# Patient Record
Sex: Female | Born: 1985 | Race: White | Hispanic: No | Marital: Married | Smoking: Never smoker
Health system: Southern US, Community
[De-identification: ages and names within clinical notes are randomized; demographics above are authoritative.]

## PROBLEM LIST (undated history)

## (undated) DIAGNOSIS — J301 Allergic rhinitis due to pollen: Secondary | ICD-10-CM

## (undated) DIAGNOSIS — B019 Varicella without complication: Secondary | ICD-10-CM

## (undated) DIAGNOSIS — R17 Unspecified jaundice: Secondary | ICD-10-CM

## (undated) DIAGNOSIS — O9A213 Injury, poisoning and certain other consequences of external causes complicating pregnancy, third trimester: Secondary | ICD-10-CM

## (undated) DIAGNOSIS — J45909 Unspecified asthma, uncomplicated: Secondary | ICD-10-CM

## (undated) DIAGNOSIS — F419 Anxiety disorder, unspecified: Secondary | ICD-10-CM

## (undated) DIAGNOSIS — N921 Excessive and frequent menstruation with irregular cycle: Secondary | ICD-10-CM

## (undated) DIAGNOSIS — F32A Depression, unspecified: Secondary | ICD-10-CM

## (undated) DIAGNOSIS — T7840XA Allergy, unspecified, initial encounter: Secondary | ICD-10-CM

## (undated) DIAGNOSIS — K219 Gastro-esophageal reflux disease without esophagitis: Secondary | ICD-10-CM

## (undated) HISTORY — DX: Anxiety disorder, unspecified: F41.9

## (undated) HISTORY — DX: Allergy, unspecified, initial encounter: T78.40XA

## (undated) HISTORY — DX: Unspecified asthma, uncomplicated: J45.909

## (undated) HISTORY — DX: Depression, unspecified: F32.A

## (undated) HISTORY — PX: WISDOM TOOTH EXTRACTION: SHX21

## (undated) HISTORY — DX: Gastro-esophageal reflux disease without esophagitis: K21.9

## (undated) HISTORY — DX: Unspecified jaundice: R17

## (undated) HISTORY — DX: Excessive and frequent menstruation with irregular cycle: N92.1

## (undated) HISTORY — DX: Varicella without complication: B01.9

## (undated) HISTORY — DX: Allergic rhinitis due to pollen: J30.1

---

## 2008-01-06 ENCOUNTER — Other Ambulatory Visit: Admission: RE | Admit: 2008-01-06 | Discharge: 2008-01-06 | Payer: Self-pay | Admitting: Obstetrics and Gynecology

## 2008-12-25 ENCOUNTER — Other Ambulatory Visit: Admission: RE | Admit: 2008-12-25 | Discharge: 2008-12-25 | Payer: Self-pay | Admitting: Obstetrics and Gynecology

## 2011-08-09 HISTORY — PX: CERVICAL BIOPSY  W/ LOOP ELECTRODE EXCISION: SUR135

## 2011-12-25 LAB — HM PAP SMEAR: HM Pap smear: NORMAL

## 2012-05-17 ENCOUNTER — Ambulatory Visit: Payer: Self-pay | Admitting: Internal Medicine

## 2012-05-24 ENCOUNTER — Ambulatory Visit (INDEPENDENT_AMBULATORY_CARE_PROVIDER_SITE_OTHER): Payer: BC Managed Care – PPO | Admitting: Internal Medicine

## 2012-05-24 ENCOUNTER — Encounter: Payer: Self-pay | Admitting: Internal Medicine

## 2012-05-24 VITALS — BP 130/70 | HR 60 | Temp 98.5°F | Ht 68.5 in | Wt 177.8 lb

## 2012-05-24 DIAGNOSIS — IMO0002 Reserved for concepts with insufficient information to code with codable children: Secondary | ICD-10-CM

## 2012-05-24 DIAGNOSIS — R87619 Unspecified abnormal cytological findings in specimens from cervix uteri: Secondary | ICD-10-CM | POA: Insufficient documentation

## 2012-05-24 DIAGNOSIS — J45990 Exercise induced bronchospasm: Secondary | ICD-10-CM | POA: Insufficient documentation

## 2012-05-24 DIAGNOSIS — R6889 Other general symptoms and signs: Secondary | ICD-10-CM

## 2012-05-24 NOTE — Assessment & Plan Note (Signed)
Symptomatically doing well with albuterol prior to exercise. Will continue to monitor. Will get records on prior evaluation.

## 2012-05-24 NOTE — Progress Notes (Signed)
  Subjective:    Patient ID: Elizabeth Harding, female    DOB: 1985/12/30, 26 y.o.   MRN: [id number]  HPI 26 year old female with history of asthma presents to establish care. She reports that she is generally doing well. She has no concerns today. She reports that her asthma symptoms of shortness of breath and cough are well controlled with using albuterol prior to exercise. She has never been hospitalized for asthma. She does not smoke. She is not exposed to secondhand smoke. She has never been on maintenance medications.  She generally reports she is feeling well. She is very active, exercising with a trainer and swimming several days per week. She follows a relatively healthy diet.  Outpatient Encounter Prescriptions as of 05/24/2012  Medication Sig Dispense Refill  . albuterol (PROVENTIL HFA;VENTOLIN HFA) 108 (90 BASE) MCG/ACT inhaler Inhale 2 puffs into the lungs every 6 (six) hours as needed.      . norethindrone-ethinyl estradiol (MICROGESTIN,JUNEL,LOESTRIN) 1-20 MG-MCG tablet Take 1 tablet by mouth daily.        Review of Systems  Constitutional: Negative for fever, chills, appetite change, fatigue and unexpected weight change.  HENT: Negative for ear pain, congestion, sore throat, trouble swallowing, neck pain, voice change and sinus pressure.   Eyes: Negative for visual disturbance.  Respiratory: Negative for cough, shortness of breath, wheezing and stridor.   Cardiovascular: Negative for chest pain, palpitations and leg swelling.  Gastrointestinal: Negative for nausea, vomiting, abdominal pain, diarrhea, constipation, blood in stool, abdominal distention and anal bleeding.  Genitourinary: Negative for dysuria and flank pain.  Musculoskeletal: Negative for myalgias, arthralgias and gait problem.  Skin: Negative for color change and rash.  Neurological: Negative for dizziness and headaches.  Hematological: Negative for adenopathy. Does not bruise/bleed easily.    Psychiatric/Behavioral: Negative for suicidal ideas, disturbed wake/sleep cycle and dysphoric mood. The patient is not nervous/anxious.        Objective:   Physical Exam  Constitutional: She is oriented to person, place, and time. She appears well-developed and well-nourished. No distress.  HENT:  Head: Normocephalic and atraumatic.  Right Ear: External ear normal.  Left Ear: External ear normal.  Nose: Nose normal.  Mouth/Throat: Oropharynx is clear and moist. No oropharyngeal exudate.  Eyes: Conjunctivae are normal. Pupils are equal, round, and reactive to light. Right eye exhibits no discharge. Left eye exhibits no discharge. No scleral icterus.  Neck: Normal range of motion. Neck supple. No tracheal deviation present. No thyromegaly present.  Cardiovascular: Normal rate, regular rhythm, normal heart sounds and intact distal pulses.  Exam reveals no gallop and no friction rub.   No murmur heard. Pulmonary/Chest: Effort normal and breath sounds normal. No respiratory distress. She has no wheezes. She has no rales. She exhibits no tenderness.  Abdominal: Soft. Bowel sounds are normal. She exhibits no distension and no mass. There is no tenderness. There is no guarding.  Musculoskeletal: Normal range of motion. She exhibits no edema and no tenderness.  Lymphadenopathy:    She has no cervical adenopathy.  Neurological: She is alert and oriented to person, place, and time. No cranial nerve deficit. She exhibits normal muscle tone. Coordination normal.  Skin: Skin is warm and dry. No rash noted. She is not diaphoretic. No erythema. No pallor.  Psychiatric: She has a normal mood and affect. Her behavior is normal. Judgment and thought content normal.          Assessment & Plan:

## 2012-05-24 NOTE — Assessment & Plan Note (Signed)
Patient reports history of abnormal Pap smear status post LEEP procedure in the past. Will get notes on previous evaluation.

## 2012-07-22 ENCOUNTER — Telehealth: Payer: Self-pay | Admitting: Internal Medicine

## 2012-07-22 NOTE — Telephone Encounter (Signed)
Will address at office visit

## 2012-07-22 NOTE — Telephone Encounter (Signed)
Caller: Julieth/Patient; Patient Name: Elizabeth Harding; PCP: Ronna Polio; Best Callback Phone Number: [phone]. Left Wrist Pain.Onset about 4 weeks ago.  No known trauma.  Pain when weight bearing and with flexion and extension.  All emergent signs and symptoms ruled out per Wrist Non Injury  with exception to 'Limitation of previously tolerated activities'.   See Provider within 2 wks. Appt scheduled 07/23/12 @ 0945 for Dr. Darrick Huntsman.

## 2012-07-23 ENCOUNTER — Ambulatory Visit (INDEPENDENT_AMBULATORY_CARE_PROVIDER_SITE_OTHER)
Admission: RE | Admit: 2012-07-23 | Discharge: 2012-07-23 | Disposition: A | Discharge status: Home / Self Care | Payer: BC Managed Care – PPO | Source: Ambulatory Visit | Attending: Internal Medicine | Admitting: Internal Medicine

## 2012-07-23 ENCOUNTER — Ambulatory Visit (INDEPENDENT_AMBULATORY_CARE_PROVIDER_SITE_OTHER): Payer: BC Managed Care – PPO | Admitting: Internal Medicine

## 2012-07-23 ENCOUNTER — Encounter: Payer: Self-pay | Admitting: Internal Medicine

## 2012-07-23 VITALS — BP 118/60 | HR 62 | Temp 98.0°F | Resp 16 | Wt 179.2 lb

## 2012-07-23 DIAGNOSIS — M25539 Pain in unspecified wrist: Secondary | ICD-10-CM

## 2012-07-23 DIAGNOSIS — M25532 Pain in left wrist: Secondary | ICD-10-CM

## 2012-07-23 LAB — COMPREHENSIVE METABOLIC PANEL
ALT: 18 U/L (ref 0–35)
AST: 24 U/L (ref 0–37)
Albumin: 4.1 g/dL (ref 3.5–5.2)
BUN: 9 mg/dL (ref 6–23)
Calcium: 9.1 mg/dL (ref 8.4–10.5)
Chloride: 104 mEq/L (ref 96–112)
Potassium: 3.6 mEq/L (ref 3.5–5.1)

## 2012-07-23 NOTE — Progress Notes (Signed)
Patient ID: Elizabeth Harding, female   DOB: 1986-10-27, 26 y.o.   MRN: [id number]  Patient Active Problem List  Diagnosis  . Exercise-induced asthma  . Abnormal pap  . Left wrist pain    Subjective:  CC:   Chief Complaint  Patient presents with  . Wrist Pain    HPI:   Elizabeth Harding a 26 y.o. female who present Lwith new onset left sided wrist pain.  Symptoms started about 4 weeks ago and resolved after a couple of days of using a soft wrist brace. There is no history of trauma. The last 3-4 weeks she has had intermittent pain lasting a day or 2 at a time. The last week she started having persistent pain and changed the brace to one with a metal stave in it. Despite using the brace and avoiding any physical activity she has had increased pain and can now barely put any weight on the wrist all without pain. 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 No history, no history of weight lifting or sports activities. No history of inflammatory arthritis. Spends a maximum of 2 hours a day at the keyboard. No prior trial of anti-inflammatories.   Past Medical History  Diagnosis Date  . Asthma   . Chicken pox   . Allergy     hay fever  . Jaundice     as a newborn    Past Surgical History  Procedure Date  . Wisdom tooth extraction   . Cervical biopsy  w/ loop electrode excision     CIN[facility]         The following portions of the patient's history were reviewed and updated as appropriate: Allergies, current medications, and problem list.    Review of Systems:   12 Pt  review of systems was negative except those addressed in the HPI.     History   Social History  . Marital Status: Single    Spouse Name: N/A    Number of Children: N/A  . Years of Education: N/A   Occupational History  . Not on file.   Social History Main Topics  . Smoking status: Never Smoker   . Smokeless tobacco: Not on file  .  Alcohol Use: Yes     occas  . Drug Use: Not on file  . Sexually Active: Not on file   Other Topics Concern  . Not on file   Social History Narrative   Lives alone in [place]. Has cat. Teacher.Exercise - [place], with trainerDiet - regular    Objective:  BP 118/60  Pulse 62  Temp 98 F (36.7 C) (Oral)  Resp 16  Wt 179 lb 4 oz (81.307 kg)  SpO2 99%  LMP 07/05/2012  General appearance: alert, cooperative and appears stated age Neck: no adenopathy, no carotid bruit, supple, symmetrical, trachea midline and thyroid not enlarged, symmetric, no tenderness/mass/nodules Back: symmetric, no curvature. ROM normal. No CVA tenderness. Lungs: clear to auscultation bilaterally Heart: regular rate and rhythm, S1, S2 normal, no murmur, click, rub or gallop Abdomen: soft, non-tender; bowel sounds normal; no masses,  no organomegaly Pulses: 2+ and symmetric Skin: Skin color, texture, turgor normal. No rashes or lesions MSK: left wrist without swelling, warmth or redness.  ROM full.  Some pain with extension. No focal tenderness. Lymph nodes: Cervical, supraclavicular, and axillary nodes normal.  Assessment and Plan:  Left wrist pain Ideology appears to be tendinitis or  arthritis. Plain views of the wrist showed no evidence of fractures or joint space widening. Recommended trial of anti-inflammatories and pain relievers with continued use of brace. If no improvement 2 weeks will refer to rheumatologist versus sports medicine doctor depending on the results of the autoimmune serologies I sent off for today.   Updated Medication List Outpatient Encounter Prescriptions as of 07/23/2012  Medication Sig Dispense Refill  . albuterol (PROVENTIL HFA;VENTOLIN HFA) 108 (90 BASE) MCG/ACT inhaler Inhale 2 puffs into the lungs every 6 (six) hours as needed.      . norethindrone-ethinyl estradiol (MICROGESTIN,JUNEL,LOESTRIN) 1-20 MG-MCG tablet Take 1 tablet by mouth daily.         Orders Placed This Encounter   Procedures  . DG Wrist Complete Left  . Comprehensive metabolic panel  . Sedimentation rate  . Rheumatoid factor  . ANA  . Uric acid    No Follow-up on file.

## 2012-07-23 NOTE — Patient Instructions (Signed)
You may use 800 mg ibuprofen  Every 8 hours or the alleve twice daily  ( as directed ) PLUS tylenol (max 2000 mg daily )    blood tests here,  A x rays at [facility]

## 2012-07-25 ENCOUNTER — Encounter: Payer: Self-pay | Admitting: Internal Medicine

## 2012-07-25 DIAGNOSIS — M25532 Pain in left wrist: Secondary | ICD-10-CM | POA: Insufficient documentation

## 2012-07-25 NOTE — Assessment & Plan Note (Signed)
Ideology appears to be tendinitis or arthritis. Plain views of the wrist showed no evidence of fractures or joint space widening. Recommended trial of anti-inflammatories and pain relievers with continued use of brace. If no improvement 2 weeks will refer to rheumatologist versus sports medicine doctor depending on the results of the autoimmune serologies I sent off for today.

## 2012-12-27 ENCOUNTER — Encounter: Payer: Self-pay | Admitting: Family Medicine

## 2012-12-27 ENCOUNTER — Ambulatory Visit (INDEPENDENT_AMBULATORY_CARE_PROVIDER_SITE_OTHER): Payer: BC Managed Care – PPO | Admitting: Family Medicine

## 2012-12-27 VITALS — BP 110/70 | HR 83 | Temp 98.5°F | Ht 68.5 in | Wt 181.8 lb

## 2012-12-27 DIAGNOSIS — J45990 Exercise induced bronchospasm: Secondary | ICD-10-CM

## 2012-12-27 MED ORDER — ALBUTEROL SULFATE HFA 108 (90 BASE) MCG/ACT IN AERS: 2.0000 | INHALATION_SPRAY | Freq: Four times a day (QID) | RESPIRATORY_TRACT | Status: DC | PRN

## 2012-12-27 NOTE — Progress Notes (Signed)
[facility] [address] [place] [address] Phone: [phone] Fax: [phone]  Date:  12/27/2012   Name:  Elizabeth Harding   DOB:  Apr 06, 1986   MRN:  [id number] Gender: female Age: 27 y.o.  PCP:  Hannah Beat, MD  Evaluating MD: Hannah Beat, MD   Chief Complaint: Establish Care   History of Present Illness:  Elizabeth Harding is a 27 y.o. pleasant patient who presents with the following:  Was going to [facility] and then [employer], now transferring care to our office.  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 When she was younger, she did have rare wheezing with illness, as well. Stable, but she needs a refill.  On OCP's and sees OB-Gyn  O/w feeling well, routinely exercising and now she is working on getting her master's degree.  Patient Active Problem List  Diagnosis  . Exercise-induced asthma  . Abnormal pap    Past Medical History  Diagnosis Date  . Asthma   . Chicken pox   . Allergy     hay fever  . Jaundice     as a newborn    Past Surgical History  Procedure Date  . Wisdom tooth extraction   . 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 Smoking status: Never Smoker   . Smokeless tobacco: Not on file  . Alcohol Use: Yes     Comment: occas    Family History  Problem Relation Age of Onset  . Hypertension Other   . Diabetes Other   . Heart disease Paternal Grandmother     No Known Allergies  Medication list has been reviewed and updated.  Outpatient Prescriptions Prior to Visit  Medication Sig Dispense Refill  . albuterol (PROVENTIL HFA;VENTOLIN HFA) 108 (90 BASE) MCG/ACT inhaler Inhale 2 puffs into the lungs every 6 (six) hours as needed.      . norethindrone-ethinyl estradiol (MICROGESTIN,JUNEL,LOESTRIN) 1-20 MG-MCG tablet  Take 1 tablet by mouth daily.       Last reviewed on 07/25/2012 11:00 AM by Sherlene Shams, MD  Review of Systems:   GEN: No acute illnesses, no fevers, chills. GI: No n/v/d, eating normally Pulm: No SOB Interactive and getting along well at home.  Otherwise, ROS is as per the HPI.   Physical Examination: BP 110/70  Pulse 83  Temp 98.5 F (36.9 C) (Oral)  Ht 5' 8.5" (1.74 m)  Wt 181 lb 12 oz (82.441 kg)  BMI 27.23 kg/m2  SpO2 98%  Ideal Body Weight: Weight in (lb) to have BMI = 25: 166.5    GEN: WDWN, NAD, Non-toxic, A & O x 3 HEENT: Atraumatic, Normocephalic. Neck supple. No masses, No LAD. Ears and Nose: No external deformity. CV: RRR, No M/G/R. No JVD. No thrill. No extra heart sounds. PULM: CTA B, no wheezes, crackles, rhonchi. No retractions. No resp. distress. No accessory muscle use. EXTR: No c/c/e NEURO Normal gait.  PSYCH: Normally interactive. Conversant. Not depressed or anxious appearing.  Calm demeanor.    Assessment and Plan:  1. Exercise-induced asthma     Stable pleasant new patient - refilled B-agonist  Orders Today:  No orders of the defined types were placed in this encounter.    Updated Medication List: (Includes new medications, updates to list, dose adjustments)  Meds ordered this encounter  Medications  . albuterol (PROAIR HFA) 108 (90 BASE) MCG/ACT inhaler    Sig: Inhale 2 puffs into the lungs every 6 (six) hours as needed for wheezing.    Dispense:  1 Inhaler    Refill:  4    Medications Discontinued: Medications Discontinued During This Encounter  Medication Reason  . albuterol (PROVENTIL HFA;VENTOLIN HFA) 108 (90 BASE) MCG/ACT inhaler      Hannah Beat, MD

## 2012-12-28 ENCOUNTER — Encounter: Payer: Self-pay | Admitting: Family Medicine

## 2013-05-23 ENCOUNTER — Ambulatory Visit: Payer: BC Managed Care – PPO | Admitting: Family Medicine

## 2013-05-27 ENCOUNTER — Encounter: Payer: Self-pay | Admitting: Family Medicine

## 2013-05-27 ENCOUNTER — Ambulatory Visit (INDEPENDENT_AMBULATORY_CARE_PROVIDER_SITE_OTHER): Payer: BC Managed Care – PPO | Admitting: Family Medicine

## 2013-05-27 VITALS — BP 110/66 | HR 70 | Temp 98.6°F | Wt 185.5 lb

## 2013-05-27 DIAGNOSIS — J019 Acute sinusitis, unspecified: Secondary | ICD-10-CM

## 2013-05-27 DIAGNOSIS — J0110 Acute frontal sinusitis, unspecified: Secondary | ICD-10-CM | POA: Insufficient documentation

## 2013-05-27 MED ORDER — AMOXICILLIN 500 MG PO CAPS: 1000.0000 mg | ORAL_CAPSULE | Freq: Two times a day (BID) | ORAL | Status: DC

## 2013-05-27 NOTE — Assessment & Plan Note (Signed)
>   2 weeks.. Will treat with antibitoics. Symptomatic care. Nasal irrigation.

## 2013-05-27 NOTE — Patient Instructions (Signed)
Start mucinex D. Continue using netty pot. Start and complete antibiotics. Call if not improving as expected.

## 2013-05-27 NOTE — Progress Notes (Signed)
  Subjective:    Patient ID: Elizabeth Harding, female    DOB: [birth year], 27 y.o.   MRN: [id number]  Sinusitis This is a new problem. The current episode started 1 to 4 weeks ago. The problem has been gradually worsening since onset. There has been no fever. The pain is moderate. Associated symptoms include congestion, headaches, sinus pressure and sneezing. Pertinent negatives include no chills, coughing, ear pain, shortness of breath or swollen glands. Past treatments include oral decongestants (zyrtec, sudafed). The treatment provided moderate relief.       Review of Systems  Constitutional: Negative for chills.  HENT: Positive for congestion, sneezing and sinus pressure. Negative for ear pain.   Respiratory: Negative for cough and shortness of breath.   Neurological: Positive for headaches.       Objective:   Physical Exam  Constitutional: Vital signs are normal. She appears well-developed and well-nourished. She is cooperative.  Non-toxic appearance. She does not appear ill. No distress.  HENT:  Head: Normocephalic.  Right Ear: Hearing, tympanic membrane, external ear and ear canal normal. Tympanic membrane is not erythematous, not retracted and not bulging.  Left Ear: Hearing, tympanic membrane, external ear and ear canal normal. Tympanic membrane is not erythematous, not retracted and not bulging.  Nose: Mucosal edema and rhinorrhea present. Right sinus exhibits maxillary sinus tenderness (left greater than right). Right sinus exhibits no frontal sinus tenderness. Left sinus exhibits maxillary sinus tenderness. Left sinus exhibits no frontal sinus tenderness.  Mouth/Throat: Uvula is midline, oropharynx is clear and moist and mucous membranes are normal.  Eyes: Conjunctivae, EOM and lids are normal. Pupils are equal, round, and reactive to light. No foreign bodies found.  Neck: Trachea normal and normal range of motion. Neck supple. Carotid bruit is not present. No mass and no  thyromegaly present.  Cardiovascular: Normal rate, regular rhythm, S1 normal, S2 normal, normal heart sounds, intact distal pulses and normal pulses.  Exam reveals no gallop and no friction rub.   No murmur heard. Pulmonary/Chest: Effort normal and breath sounds normal. Not tachypneic. No respiratory distress. She has no decreased breath sounds. She has no wheezes. She has no rhonchi. She has no rales.  Neurological: She is alert.  Skin: Skin is warm, dry and intact. No rash noted.  Psychiatric: Her speech is normal and behavior is normal. Judgment normal. Her mood appears not anxious. Cognition and memory are normal. She does not exhibit a depressed mood.          Assessment & Plan:

## 2013-11-15 ENCOUNTER — Encounter: Payer: Self-pay | Admitting: Nurse Practitioner

## 2013-12-01 ENCOUNTER — Other Ambulatory Visit: Payer: Self-pay | Admitting: Nurse Practitioner

## 2013-12-05 NOTE — Telephone Encounter (Signed)
Annual Exam scheduled for 01/17/14

## 2014-01-10 ENCOUNTER — Ambulatory Visit: Payer: Self-pay | Admitting: Nurse Practitioner

## 2014-01-17 ENCOUNTER — Ambulatory Visit: Payer: Self-pay | Admitting: Nurse Practitioner

## 2014-01-23 ENCOUNTER — Encounter: Payer: Self-pay | Admitting: Nurse Practitioner

## 2014-01-23 ENCOUNTER — Ambulatory Visit (INDEPENDENT_AMBULATORY_CARE_PROVIDER_SITE_OTHER): Payer: BC Managed Care – PPO | Admitting: Nurse Practitioner

## 2014-01-23 VITALS — BP 120/74 | HR 68 | Ht 68.0 in | Wt 177.0 lb

## 2014-01-23 DIAGNOSIS — Z01419 Encounter for gynecological examination (general) (routine) without abnormal findings: Secondary | ICD-10-CM

## 2014-01-23 DIAGNOSIS — R87612 Low grade squamous intraepithelial lesion on cytologic smear of cervix (LGSIL): Secondary | ICD-10-CM

## 2014-01-23 DIAGNOSIS — Z0000 Encounter for general adult medical examination without abnormal findings: Secondary | ICD-10-CM

## 2014-01-23 LAB — POCT URINALYSIS DIPSTICK
Bilirubin, UA: NEGATIVE
Blood, UA: NEGATIVE
Glucose, UA: NEGATIVE
Ketones, UA: NEGATIVE
Leukocytes, UA: NEGATIVE
Nitrite, UA: NEGATIVE
Protein, UA: NEGATIVE
Urobilinogen, UA: NEGATIVE
pH, UA: 6

## 2014-01-23 MED ORDER — NORETHIN ACE-ETH ESTRAD-FE 1-20 MG-MCG PO TABS: ORAL_TABLET | ORAL | Status: DC

## 2014-01-23 NOTE — Patient Instructions (Addendum)
General topics  Next pap or exam is  due in 1 year Take a Women's multivitamin Take 1200 mg. of calcium daily - prefer dietary If any concerns in interim to call back  Breast Self-Awareness Practicing breast self-awareness may pick up problems early, prevent significant medical complications, and possibly save your life. By practicing breast self-awareness, you can become familiar with how your breasts look and feel and if your breasts are changing. This allows you to notice changes early. It can also offer you some reassurance that your breast health is good. One way to learn what is normal for your breasts and whether your breasts are changing is to do a breast self-exam. If you find a lump or something that was not present in the past, it is best to contact your caregiver right away. Other findings that should be evaluated by your caregiver include nipple discharge, especially if it is bloody; skin changes or reddening; areas where the skin seems to be pulled in (retracted); or new lumps and bumps. Breast pain is seldom associated with cancer (malignancy), but should also be evaluated by a caregiver. BREAST SELF-EXAM The best time to examine your breasts is 5 7 days after your menstrual period is over.  ExitCare Patient Information 2013 ExitCare, LLC.   Exercise to Stay Healthy Exercise helps you become and stay healthy. EXERCISE IDEAS AND TIPS Choose exercises that:  You enjoy.  Fit into your day. You do not need to exercise really hard to be healthy. You can do exercises at a slow or medium level and stay healthy. You can:  Stretch before and after working out.  Try yoga, Pilates, or tai chi.  Lift weights.  Walk fast, swim, jog, run, climb stairs, bicycle, dance, or rollerskate.  Take aerobic classes. Exercises that burn about 150 calories:  Running 1  miles in 15 minutes.  Playing volleyball for 45 to 60 minutes.  Washing and waxing a car for 45 to 60  minutes.  Playing touch football for 45 minutes.  Walking 1  miles in 35 minutes.  Pushing a stroller 1  miles in 30 minutes.  Playing basketball for 30 minutes.  Raking leaves for 30 minutes.  Bicycling 5 miles in 30 minutes.  Walking 2 miles in 30 minutes.  Dancing for 30 minutes.  Shoveling snow for 15 minutes.  Swimming laps for 20 minutes.  Walking up stairs for 15 minutes.  Bicycling 4 miles in 15 minutes.  Gardening for 30 to 45 minutes.  Jumping rope for 15 minutes.  Washing windows or floors for 45 to 60 minutes. Document Released: 12/27/2010 Document Revised: 02/16/2012 Document Reviewed: 12/27/2010 ExitCare Patient Information 2013 ExitCare, LLC.   Other topics ( that may be useful information):    Sexually Transmitted Disease Sexually transmitted disease (STD) refers to any infection that is passed from person to person during sexual activity. This may happen by way of saliva, semen, blood, vaginal mucus, or urine. Common STDs include:  Gonorrhea.  Chlamydia.  Syphilis.  HIV/AIDS.  Genital herpes.  Hepatitis B and C.  Trichomonas.  Human papillomavirus (HPV).  Pubic lice. CAUSES  An STD may be spread by bacteria, virus, or parasite. A person can get an STD by:  Sexual intercourse with an infected person.  Sharing sex toys with an infected person.  Sharing needles with an infected person.  Having intimate contact with the genitals, mouth, or rectal areas of an infected person. SYMPTOMS  Some people may not have any symptoms, but   they can still pass the infection to others. Different STDs have different symptoms. Symptoms include:  Painful or bloody urination.  Pain in the pelvis, abdomen, vagina, anus, throat, or eyes.  Skin rash, itching, irritation, growths, or sores (lesions). These usually occur in the genital or anal area.  Abnormal vaginal discharge.  Penile discharge in men.  Soft, flesh-colored skin growths in the  genital or anal area.  Fever.  Pain or bleeding during sexual intercourse.  Swollen glands in the groin area.  Yellow skin and eyes (jaundice). This is seen with hepatitis. DIAGNOSIS  To make a diagnosis, your caregiver may:  Take a medical history.  Perform a physical exam.  Take a specimen (culture) to be examined.  Examine a sample of discharge under a microscope.  Perform blood test TREATMENT   Chlamydia, gonorrhea, trichomonas, and syphilis can be cured with antibiotic medicine.  Genital herpes, hepatitis, and HIV can be treated, but not cured, with prescribed medicines. The medicines will lessen the symptoms.  Genital warts from HPV can be treated with medicine or by freezing, burning (electrocautery), or surgery. Warts may come back.  HPV is a virus and cannot be cured with medicine or surgery.However, abnormal areas may be followed very closely by your caregiver and may be removed from the cervix, vagina, or vulva through office procedures or surgery. If your diagnosis is confirmed, your recent sexual partners need treatment. This is true even if they are symptom-free or have a negative culture or evaluation. They should not have sex until their caregiver says it is okay. HOME CARE INSTRUCTIONS  All sexual partners should be informed, tested, and treated for all STDs.  Take your antibiotics as directed. Finish them even if you start to feel better.  Only take over-the-counter or prescription medicines for pain, discomfort, or fever as directed by your caregiver.  Rest.  Eat a balanced diet and drink enough fluids to keep your urine clear or pale yellow.  Do not have sex until treatment is completed and you have followed up with your caregiver. STDs should be checked after treatment.  Keep all follow-up appointments, Pap tests, and blood tests as directed by your caregiver.  Only use latex condoms and water-soluble lubricants during sexual activity. Do not use  petroleum jelly or oils.  Avoid alcohol and illegal drugs.  Get vaccinated for HPV and hepatitis. If you have not received these vaccines in the past, talk to your caregiver about whether one or both might be right for you.  Avoid risky sex practices that can break the skin. The only way to avoid getting an STD is to avoid all sexual activity.Latex condoms and dental dams (for oral sex) will help lessen the risk of getting an STD, but will not completely eliminate the risk. SEEK MEDICAL CARE IF:   You have a fever.  You have any new or worsening symptoms. Document Released: 02/14/2003 Document Revised: 02/16/2012 Document Reviewed: 02/21/2011 [facility] Patient Information 2013 [place].    Domestic Abuse You are being battered or abused if someone close to you hits, pushes, or physically hurts you in any way. You also are being abused if you are forced into activities. You are being sexually abused if you are forced to have sexual contact of any kind. You are being emotionally abused if you are made to feel worthless or if you are constantly threatened. It is important to remember that help is available. No one has the right to abuse you. PREVENTION OF FURTHER  ABUSE  Learn the warning signs of danger. This varies with situations but may include: the use of alcohol, threats, isolation from friends and family, or forced sexual contact. Leave if you feel that violence is going to occur.  If you are attacked or beaten, report it to the police so the abuse is documented. You do not have to press charges. The police can protect you while you or the attackers are leaving. Get the officer's name and badge number and a copy of the report.  Find someone you can trust and tell them what is happening to you: your caregiver, a nurse, clergy member, close friend or family member. Feeling ashamed is natural, but remember that you have done nothing wrong. No one deserves abuse. Document Released:  11/21/2000 Document Revised: 02/16/2012 Document Reviewed: 01/30/2011 ExitCare Patient Information 2013 ExitCare, LLC.    How Much is Too Much Alcohol? Drinking too much alcohol can cause injury, accidents, and health problems. These types of problems can include:   Car crashes.  Falls.  Family fighting (domestic violence).  Drowning.  Fights.  Injuries.  Burns.  Damage to certain organs.  Having a baby with birth defects. ONE DRINK CAN BE TOO MUCH WHEN YOU ARE:  Working.  Pregnant or breastfeeding.  Taking medicines. Ask your doctor.  Driving or planning to drive. If you or someone you know has a drinking problem, get help from a doctor.  Document Released: 09/20/2009 Document Revised: 02/16/2012 Document Reviewed: 09/20/2009 ExitCare Patient Information 2013 ExitCare, LLC.   Smoking Hazards Smoking cigarettes is extremely bad for your health. Tobacco smoke has over 200 known poisons in it. There are over 60 chemicals in tobacco smoke that cause cancer. Some of the chemicals found in cigarette smoke include:   Cyanide.  Benzene.  Formaldehyde.  Methanol (wood alcohol).  Acetylene (fuel used in welding torches).  Ammonia. Cigarette smoke also contains the poisonous gases nitrogen oxide and carbon monoxide.  Cigarette smokers have an increased risk of many serious medical problems and Smoking causes approximately:  90% of all lung cancer deaths in men.  80% of all lung cancer deaths in women.  90% of deaths from chronic obstructive lung disease. Compared with nonsmokers, smoking increases the risk of:  Coronary heart disease by 2 to 4 times.  Stroke by 2 to 4 times.  Men developing lung cancer by 23 times.  Women developing lung cancer by 13 times.  Dying from chronic obstructive lung diseases by 12 times.  . Smoking is the most preventable cause of death and disease in our society.  WHY IS SMOKING ADDICTIVE?  Nicotine is the chemical  agent in tobacco that is capable of causing addiction or dependence.  When you smoke and inhale, nicotine is absorbed rapidly into the bloodstream through your lungs. Nicotine absorbed through the lungs is capable of creating a powerful addiction. Both inhaled and non-inhaled nicotine may be addictive.  Addiction studies of cigarettes and spit tobacco show that addiction to nicotine occurs mainly during the teen years, when young people begin using tobacco products. WHAT ARE THE BENEFITS OF QUITTING?  There are many health benefits to quitting smoking.   Likelihood of developing cancer and heart disease decreases. Health improvements are seen almost immediately.  Blood pressure, pulse rate, and breathing patterns start returning to normal soon after quitting. QUITTING SMOKING   American Lung Association - 1-800-LUNGUSA  American Cancer Society - 1-800-ACS-2345 Document Released: 01/01/2005 Document Revised: 02/16/2012 Document Reviewed: 09/05/2009 ExitCare Patient Information 2013 ExitCare,   LLC.   Stress Management Stress is a state of physical or mental tension that often results from changes in your life or normal routine. Some common causes of stress are:  Death of a loved one.  Injuries or severe illnesses.  Getting fired or changing jobs.  Moving into a new home. Other causes may be:  Sexual problems.  Business or financial losses.  Taking on a large debt.  Regular conflict with someone at home or at work.  Constant tiredness from lack of sleep. It is not just bad things that are stressful. It may be stressful to:  Win the lottery.  Get married.  Buy a new car. The amount of stress that can be easily tolerated varies from person to person. Changes generally cause stress, regardless of the types of change. Too much stress can affect your health. It may lead to physical or emotional problems. Too little stress (boredom) may also become stressful. SUGGESTIONS TO  REDUCE STRESS:  Talk things over with your family and friends. It often is helpful to share your concerns and worries. If you feel your problem is serious, you may want to get help from a professional counselor.  Consider your problems one at a time instead of lumping them all together. Trying to take care of everything at once may seem impossible. List all the things you need to do and then start with the most important one. Set a goal to accomplish 2 or 3 things each day. If you expect to do too many in a single day you will naturally fail, causing you to feel even more stressed.  Do not use alcohol or drugs to relieve stress. Although you may feel better for a short time, they do not remove the problems that caused the stress. They can also be habit forming.  Exercise regularly - at least 3 times per week. Physical exercise can help to relieve that "uptight" feeling and will relax you.  The shortest distance between despair and hope is often a good night's sleep.  Go to bed and get up on time allowing yourself time for appointments without being rushed.  Take a short "time-out" period from any stressful situation that occurs during the day. Close your eyes and take some deep breaths. Starting with the muscles in your face, tense them, hold it for a few seconds, then relax. Repeat this with the muscles in your neck, shoulders, hand, stomach, back and legs.  Take good care of yourself. Eat a balanced diet and get plenty of rest.  Schedule time for having fun. Take a break from your daily routine to relax. HOME CARE INSTRUCTIONS   Call if you feel overwhelmed by your problems and feel you can no longer manage them on your own.  Return immediately if you feel like hurting yourself or someone else. Document Released: 05/20/2001 Document Revised: 02/16/2012 Document Reviewed: 01/10/2008 [facility] Patient Information 2013 [place].    Husband needs TDaP - grandparents also  MMR II

## 2014-01-23 NOTE — Progress Notes (Signed)
Patient ID: Elizabeth Harding, female   DOB: 1986/01/10, 28 y.o.   MRN: [id number] 28 y.o. G0P0 Single Caucasian Fe here for annual exam.  She is very excited about upcoming wedding on March 28 th. Engaged last August and had originally planned wedding for the fall.  Because of various issues with school they have changed to date; now her parents are less happy about their wedding plans.   Menses are normal, flow for 5 days   Patient's last menstrual period was 01/16/2014.          Sexually active: yes  The current method of family planning is OCP (estrogen/progesterone) and condoms all of the time.    Exercising: yes  cardio and weights Smoker:  no  Health Maintenance: Pap:  01/07/13, WNL, neg HR HPV TDaP:  2012 Labs: HB:  14.8 Urine:  Negative   reports that she has never smoked. She has never used smokeless tobacco. She reports that she drinks alcohol. She reports that she does not use illicit drugs.  Past Medical History  Diagnosis Date  . Asthma   . Allergic rhinitis due to pollen     hay fever  . Jaundice     as a newborn    Past Surgical History  Procedure Laterality Date  . Wisdom tooth extraction    . Cervical biopsy  w/ loop electrode excision  08/2011    CIN2, [facility]    Current Outpatient Prescriptions  Medication Sig Dispense Refill  . albuterol (PROAIR HFA) 108 (90 BASE) MCG/ACT inhaler Inhale 2 puffs into the lungs every 6 (six) hours as needed for wheezing.  1 Inhaler  4  . norethindrone-ethinyl estradiol (JUNEL FE 1/20) 1-20 MG-MCG tablet TAKE 1 TABLET BY MOUTH EVERY DAY  84 tablet  3   No current facility-administered medications for this visit.    Family History  Problem Relation Age of Onset  . Hypertension Other   . Heart disease Paternal Grandmother     ROS:  Pertinent items are noted in HPI.  Otherwise, a comprehensive ROS was negative.  Exam:   BP 120/74  Pulse 68  Ht 5\' 8"  (1.727 m)  Wt 177 lb (80.287 kg)  BMI 26.92 kg/m2   LMP 01/16/2014 Height: 5\' 8"  (172.7 cm)  Ht Readings from Last 3 Encounters:  01/23/14 5\' 8"  (1.727 m)  12/27/12 5' 8.5" (1.74 m)  05/24/12 5' 8.5" (1.74 m)    General appearance: alert, cooperative and appears stated age Head: Normocephalic, without obvious abnormality, atraumatic Neck: no adenopathy, supple, symmetrical, trachea midline and thyroid normal to inspection and palpation Lungs: clear to auscultation bilaterally Breasts: normal appearance, no masses or tenderness Heart: regular rate and rhythm Abdomen: soft, non-tender; no masses,  no organomegaly Extremities: extremities normal, atraumatic, no cyanosis or edema Skin: Skin color, texture, turgor normal. No rashes or lesions Lymph nodes: Cervical, supraclavicular, and axillary nodes normal. No abnormal inguinal nodes palpated Neurologic: Grossly normal   Pelvic: External genitalia:  no lesions              Urethra:  normal appearing urethra with no masses, tenderness or lesions              Bartholin's and Skene's: normal                 Vagina: normal appearing vagina with normal color and discharge, no lesions              Cervix: anteverted  Pap taken: yes Bimanual Exam:  Uterus:  normal size, contour, position, consistency, mobility, non-tender              Adnexa: no mass, fullness, tenderness               Rectovaginal: Confirms               Anus:  normal sphincter tone, no lesions  A:  Well Woman with normal exam  OCP for contraception  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  Counseled on breast self exam, adequate intake of calcium and vitamin D, diet and exercise return annually or prn  An After Visit Summary was printed and given to the patient.

## 2014-01-24 LAB — HEMOGLOBIN, FINGERSTICK: HEMOGLOBIN, FINGERSTICK: 14.8 g/dL (ref 12.0–16.0)

## 2014-01-24 NOTE — Progress Notes (Signed)
Encounter reviewed by Dr. Makinsley Schiavi Silva.  

## 2014-01-26 LAB — IPS PAP TEST WITH REFLEX TO HPV

## 2014-04-11 ENCOUNTER — Telehealth: Payer: Self-pay | Admitting: Family Medicine

## 2014-04-11 NOTE — Telephone Encounter (Signed)
Patient Information:  Caller Name: Sharyl NimrodMeredith  Phone: [phone]  Patient: Elizabeth Harding, Elizabeth Harding  Gender: Female  DOB: [birth year]  Age: 28 Years  PCP: Hannah Beatopland, [facility](Family Practice)  Pregnant: No  Office Follow Up:  Does the office need to follow up with this patient?: No  Instructions For The Office: N/A  RN Note:  Pt began having diarrhea today. 5-6 stools. Sipping fluids. Voiding qs per pt.  Symptoms  Reason For Call & Symptoms: Diarrhea  Reviewed Health History In EMR: Yes  Reviewed Medications In EMR: Yes  Reviewed Allergies In EMR: Yes  Reviewed Surgeries / Procedures: Yes  Date of Onset of Symptoms: 04/11/2014 OB / GYN:  LMP: 04/10/2014  Guideline(s) Used:  Diarrhea  Disposition Per Guideline:   Home Care  Reason For Disposition Reached:   Mild diarrhea  Advice Given:  Reassurance:  In healthy adults, new-onset diarrhea is usually caused by a viral infection of the intestines, which you can treat at home. Diarrhea is the body's way of getting rid of the infection. Here are some tips on how to keep ahead of the fluid losses.  Nutrition:  Ideal initial foods include boiled starches/cereals (e.g., potatoes, rice, noodles, wheat, oats) with a small amount of salt to taste.  Other acceptable foods include: bananas, yogurt, crackers, soup.  Nutrition:  Ideal initial foods include boiled starches/cereals (e.g., potatoes, rice, noodles, wheat, oats) with a small amount of salt to taste.  Other acceptable foods include: bananas, yogurt, crackers, soup.  As your stools return to normal consistency, resume a normal diet.  Diarrhea Medication  - Imodium AD:   Helps reduce diarrhea.  Adult dosage: 4 mg (2 capsules or 4 teaspoons or 20 ml) is the recommended first dose. You may take an additional 2 mg (1 capsule or 2 teaspoons or 10 ml) after each loose BM.  Expected Course:  Viral diarrhea lasts 4-7 days. Always worse on days 1 and 2.  Call Back If:  Signs of dehydration  occur (e.g., no urine for more than 12 hours, very dry mouth, lightheaded, etc.)  Diarrhea lasts over 7 days  You become worse.  Patient Will Follow Care Advice:  YES

## 2014-04-11 NOTE — Telephone Encounter (Signed)
Noted  

## 2014-06-12 ENCOUNTER — Telehealth: Payer: Self-pay | Admitting: Nurse Practitioner

## 2014-06-12 DIAGNOSIS — F419 Anxiety disorder, unspecified: Secondary | ICD-10-CM

## 2014-06-12 NOTE — Telephone Encounter (Signed)
Elizabeth FranklinPatricia Rolen-Grubb, FNP, okay to enter referral for psychiatry? Who do you recommend? Or office visit with you?

## 2014-06-12 NOTE — Telephone Encounter (Signed)
Patient calling to schedule an appointment for "a counselor recommended evaluation for assistance with anxiety." Please advise.  [facility]  [place]

## 2014-06-12 NOTE — Telephone Encounter (Signed)
Spoke with patient. Advised of message as seen below from Lauro FranklinPatricia Rolen-Grubb, FNP. Patient agreeable and verbalizes understanding. Offered to provide patient with phone number to call and schedule. Patient states that she is not in a place where she is able to write. Advised referral was placed to Berniece AndreasJulie Whitt, MSW, LCSW at [facility] off of [address] Patient is agreeable and will look up location to call for appointment. Advised patient that if she needs anything or has any difficulty finding number to call back and we would be happy to provide it to her. Patient agreeable.  Number to Berniece AndreasJulie Whitt, MSW, LCSW is [phone] if patient calls back for number.  Routing to provider for final review. Patient agreeable to disposition. Will close encounter

## 2014-06-12 NOTE — Telephone Encounter (Signed)
Referral to Berniece AndreasJulie Whitt would be advised.  She would call the phone number and leave a message that we made a referral for her.  Then when Raynelle FanningJulie is free she will call her back and schedule.

## 2014-06-12 NOTE — Telephone Encounter (Signed)
Patient left a message on the after-hours message machine 06/09/13 at 11:00 AM stating she needs an appointment. She did not specify what for. I called and left a message for her to call our office back so we can address her appointment needs.

## 2014-06-14 ENCOUNTER — Telehealth: Payer: Self-pay | Admitting: Nurse Practitioner

## 2014-06-14 NOTE — Telephone Encounter (Signed)
Spoke with patient. Advised of message as seen below from Verner Choleborah S. Leonard CNM. Patient is agreeable and will call [facility] to scheduled appointment.  Routing to Verner Choleborah S. Leonard CNM as covering Cc: Lauro FranklinPatricia Rolen-Grubb, FNP  Routing to provider for final review. Patient agreeable to disposition. Will close encounter

## 2014-06-14 NOTE — Telephone Encounter (Signed)
Patient requesting a new referral for Behavioral health. Patient was previously referred to Berniece AndreasJulie Whitt, MSW, LCSW for anxiety but states " I need to be referred to someone who can write prescriptions." Is there another provider we can recommend for patient? Please advise.  Routing to Verner Choleborah S. Leonard CNM as covering Cc: Lauro FranklinPatricia Rolen-Grubb, FNP

## 2014-06-14 NOTE — Telephone Encounter (Signed)
Dr.Parrish Macario CarlsMc Kinney  [facility]  Dr.Carry Cottle, Dr. Prentiss BellsScott Cuningham They require the patient to call for appointment no referral

## 2014-06-14 NOTE — Telephone Encounter (Signed)
Patient referred to [facility] they don't provide medication only therapy and counseling. She is already seeing a therapist. She says she needs referred to someone who can write prescriptions.

## 2014-06-21 ENCOUNTER — Encounter: Payer: Self-pay | Admitting: Family Medicine

## 2014-06-21 ENCOUNTER — Ambulatory Visit (INDEPENDENT_AMBULATORY_CARE_PROVIDER_SITE_OTHER): Payer: BC Managed Care – PPO | Admitting: Family Medicine

## 2014-06-21 VITALS — BP 106/70 | HR 65 | Temp 98.6°F | Ht 68.0 in | Wt 180.5 lb

## 2014-06-21 DIAGNOSIS — F329 Major depressive disorder, single episode, unspecified: Secondary | ICD-10-CM

## 2014-06-21 DIAGNOSIS — F32A Depression, unspecified: Secondary | ICD-10-CM

## 2014-06-21 MED ORDER — ESCITALOPRAM OXALATE 10 MG PO TABS: 10.0000 mg | ORAL_TABLET | Freq: Every day | ORAL | Status: DC

## 2014-06-21 NOTE — Progress Notes (Signed)
   [address] [place] [address] Phone: [phone] Fax: [phone]  Patient ID: Elizabeth Harding MRN: [id number], DOB: 02-May-1986, 28 y.o. Date of Encounter: 06/21/2014  Primary Physician:  Hannah BeatSpencer Lonny Eisen, MD   Chief Complaint: Depression   Subjective:   History of Present Illness:  Elizabeth Harding is a 28 y.o. very pleasant female patient who presents with the following:  March got married, and seeing a Haematologistcounsellor now. Seeing Ival BibleGary Bailey.   Saw parents for the first time into   Going on since September. Got out of school. Crying some at home. Not sleeping as well. Waking up more at night. Sleeping longer. 8-9 hours a night. Napping in the afternoon. Decreased energy.  Anhedonia. No guilty feelings.   Some irritable.  Husband went to counselling.  He would like to help.   Not much exercise.  No SI or HI  Wt Readings from Last 3 Encounters:  06/21/14 180 lb 8 oz (81.874 kg)  01/23/14 177 lb (80.287 kg)  05/27/13 185 lb 8 oz (84.142 kg)     Past Medical History, Surgical History, Social History, Family History, Problem List, Medications, and Allergies have been reviewed and updated if relevant.  Review of Systems:  GEN: No acute illnesses, no fevers, chills. GI: No n/v/d, eating normally Pulm: No SOB Interactive and getting along well at home.  Otherwise, ROS is as per the HPI.  Objective:   Physical Examination: BP 106/70  Pulse 65  Temp(Src) 98.6 F (37 C) (Oral)  Ht 5\' 8"  (1.727 m)  Wt 180 lb 8 oz (81.874 kg)  BMI 27.45 kg/m2  SpO2 99%  LMP 06/05/2014   GEN: WDWN, NAD, Non-toxic, Alert & Oriented x 3 HEENT: Atraumatic, Normocephalic.  Ears and Nose: No external deformity. EXTR: No clubbing/cyanosis/edema NEURO: Normal gait.  PSYCH: Normally interactive. Conversant. Tearful intermittently   Laboratory and Imaging Data:  Assessment & Plan:   Acute depression  New onset, in counselling, start SSRI. Hopefully, more situational and  willl not need long-term meds  New Prescriptions   ESCITALOPRAM (LEXAPRO) 10 MG TABLET    Take 1 tablet (10 mg total) by mouth daily.   Modified Medications   No medications on file   No orders of the defined types were placed in this encounter.   Follow-up: Return in about 5 weeks (around 07/26/2014). Unless noted above, the patient is to follow-up if symptoms worsen. Red flags were reviewed with the patient.  Signed,  Elpidio GaleaSpencer T. Emmajane Altamura, MD, CAQ Sports Medicine   Discontinued Medications   No medications on file   Current Medications at Discharge:   Medication List       This list is accurate as of: 06/21/14  9:37 AM.  Always use your most recent med list.               albuterol 108 (90 BASE) MCG/ACT inhaler  Commonly known as:  PROAIR HFA  Inhale 2 puffs into the lungs every 6 (six) hours as needed for wheezing.     escitalopram 10 MG tablet  Commonly known as:  LEXAPRO  Take 1 tablet (10 mg total) by mouth daily.     norethindrone-ethinyl estradiol 1-20 MG-MCG tablet  Commonly known as:  JUNEL FE 1/20  TAKE 1 TABLET BY MOUTH EVERY DAY

## 2014-06-21 NOTE — Progress Notes (Signed)
Pre visit review using our clinic review tool, if applicable. No additional management support is needed unless otherwise documented below in the visit note. 

## 2014-07-24 ENCOUNTER — Ambulatory Visit (INDEPENDENT_AMBULATORY_CARE_PROVIDER_SITE_OTHER): Payer: BC Managed Care – PPO | Admitting: Nurse Practitioner

## 2014-07-24 ENCOUNTER — Encounter: Payer: Self-pay | Admitting: Nurse Practitioner

## 2014-07-24 VITALS — BP 120/62 | HR 60 | Ht 67.5 in | Wt 183.0 lb

## 2014-07-24 DIAGNOSIS — Z3009 Encounter for other general counseling and advice on contraception: Secondary | ICD-10-CM

## 2014-07-24 NOTE — Progress Notes (Deleted)
Patient ID: Elizabeth Harding, female   DOB: May 30, 1986, 28 y.o.   MRN: [id number] This 28 yo G0 SW  Fe here to discuss birth control options.

## 2014-07-24 NOTE — Progress Notes (Signed)
28 y.o.Married Caucasian  Female and her husband are here for preconceptual counseling. They are currently building a home and hope to start a family in about 3-5 months.  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 Current Birth Control method:OCP Last time birth control was used:currently  PMH:  Any history of DM, HTN, epilepsy, Heart Murmur, or thyroid problems? No   If so, when did it begin? Are you or have you ever been anemic?Yes  Recent iron deficiency and is on OTC Fe.   If so for how long? Have you ever had any accidents? No What type? Do you have any allergies? No Do you take any sedatives or tranquilizers? No Any domestic violence? No Any medications? Yes - Lexapro, OCP , and Pro Air   (such as medications for acne - certain medications can cause birth defects and Ace inhibitors can cause kidney problems in the fetus.) No  Patient's Past Medical History:  Have you ever had surgery related to female organs? No other than Colpo biopsy Past pregnancies/ complications/ or miscarriages/ abortions? No ETOH? Yes - just socially Tobacco Use? No Drug use? No  Reviewed Medication list: Yes Current job exposure risk - toxins/ Lead/ Mercury:   No Hot tub/ sauna use? No Do you commonly run long distance or do strenuous exercise? No Do you eat a strict vegetarian diet? No  Partners Past Medical History:  Have you ever had surgery related to female organs? No Previous Paternity? No Testicular Injury? No ETOH:   Yes just socially Tobacco use: No Drug use No Current medications:  Miralax prn Current job exposure risk toxins/ lead/ mercury:   No Hot/ tub sauna use? No Do you commonly run long distance or do strenuous exercise? No   Patient's Family Medical  history: No Partners Family Medical History: No  Ethnic background? Mediterranean/ Asian/Chinese/ Ashkenazi Jews / [place] /[place] / [place] French - [place]   Patients FMH:      Partners FMH: Multiple Births:  Yes MA MC, MGA   Multiple Births: yes MU MGF Genetic Disorders No    Genetic Disorders No  Sickle cell      Sickle Cell  Hemophilia      Hemophilia  Cystic Fibrosis     Cystic Fibrosis  Mental retardation     Mental retardation  Downs Syndrome     Downs Syndrome  Immunization Updates: Rubella Vaccine/ titer:   Yes immunization given in college for pt.  Unsure for husband Chicken Pox / vaccination:   Yes for disease Toxoplasmosis exposure (no changing litter box):  Has cats and husband will change letter  TDaP for pt. and partner:  patient current 2012; partner needs Hepatitis B (if at high risk): No Influenza vaccine who may get pregnant during the flu season:  Yes plan to get  Recommendations:   No ETOH / Tobacco / Drugs  Limit Caffeine  No Artificial Sweeteners  No raw beef  Restrict High fat foods  Limit servings of large fish (e.g., swordfish) to 1 X month  Foods associated with Listeria transmission ( e.g., sliced delicatessen meats &  Cheese)  No hot / tub Saunas  OTC med list  Counseling:   Normal  pregnancy rates 80 % within 1 year  Rx. Prenatal Multivitamins  Discussion of timing of intercourse to ovulation  Labs: none at this time  Information handouts given  Time spent with patient and husband:  30 minutes face to face.

## 2014-07-25 ENCOUNTER — Institutional Professional Consult (permissible substitution): Payer: BC Managed Care – PPO | Admitting: Nurse Practitioner

## 2014-07-25 ENCOUNTER — Encounter: Payer: Self-pay | Admitting: Nurse Practitioner

## 2014-07-25 NOTE — Patient Instructions (Signed)
Preparing for Pregnancy Before trying to become pregnant, make an appointment with your health care provider (preconception care). The goal is to help you have a healthy, safe pregnancy. At your first appointment, your health care provider will:   Do a complete physical exam, including a Pap test.  Take a complete medical history.  Give you advice and help you resolve any problems. PRECONCEPTION CHECKLIST Here is a list of the basics to cover with your health care provider at your preconception visit:  Medical history.  Tell your health care provider about any diseases you have had. Many diseases can affect your pregnancy.  Include your partner's medical history and family history.  Make sure you have been tested for sexually transmitted infections (STIs). These can affect your pregnancy. In some cases, they can be passed to your baby. Tell your health care provider about any history of STIs.  Make sure your health care provider knows about any previous problems you have had with conception or pregnancy.  Tell your health care provider about any medicine you take. This includes herbal supplements and over-the-counter medicines.  Make sure all your immunizations are up to date. You may need to make additional appointments.  Ask your health care provider if you need any vaccinations or if there are any you should avoid.  Diet.  It is especially important to eat a healthy, balanced diet with the right nutrients when you are pregnant.  Ask your health care provider to help you get to a healthy weight before pregnancy.  If you are overweight, you are at higher risk for certain complications. These include high blood pressure, diabetes, and preterm birth.  If you are underweight, you are more likely to have a low-birth-weight baby.  Lifestyle.  Tell your health care provider about lifestyle factors such as alcohol use, drug use, or smoking.  Describe any harmful substances you may  be exposed to at work or home. These can include chemicals, pesticides, and radiation.  Mental health.  Let your health care provider know if you have been feeling depressed or anxious.  Let your health care provider know if you have a history of substance abuse.  Let your health care provider know if you do not feel safe at home. HOME INSTRUCTIONS TO PREPARE FOR PREGNANCY Follow your health care provider's advice and instructions.   Keep an accurate record of your menstrual periods. This makes it easier for your health care provider to determine your baby's due date.  Begin taking prenatal vitamins and folic acid supplements daily. Take them as directed by your health care provider.  Eat a balanced diet. Get help from a nutrition counselor if you have questions or need help.  Get regular exercise. Try to be active for at least 30 minutes a day most days of the week.  Quit smoking, if you smoke.  Do not drink alcohol.  Do not take illegal drugs.  Get medical problems, such as diabetes or high blood pressure, under control.  If you have diabetes, make sure you do the following:  Have good blood sugar control. If you have type 1 diabetes, use multiple daily doses of insulin. Do not use split-dose or premixed insulin.  Have an eye exam by a qualified eye care professional trained in caring for people with diabetes.  Get evaluated by your health care provider for cardiovascular disease.  Get to a healthy weight. If you are overweight or obese, reduce your weight with the help of a qualified health   professional such as a registered dietitian. Ask your health care provider what the right weight range is for you. HOW DO I KNOW I AM PREGNANT? You may be pregnant if you have been sexually active and you miss your period. Symptoms of early pregnancy include:   Mild cramping.  Very light vaginal bleeding (spotting).  Feeling unusually tired.  Morning sickness. If you have any of  these symptoms, take a home pregnancy test. These tests look for a hormone called human chorionic gonadotropin (hCG) in your urine. Your body begins to make this hormone during early pregnancy. These tests are very accurate. Wait until at least the first day you miss your period to take one. If you get a positive result, call your health care provider to make appointments for prenatal care. WHAT SHOULD I DO IF I BECOME PREGNANT?  Make an appointment with your health care provider by week 12 of your pregnancy at the latest.  Do not smoke. Smoking can be harmful to your baby.  Do not drink alcoholic beverages. Alcohol is related to a number of birth defects.  Avoid toxic odors and chemicals.  You may continue to have sexual intercourse if it does not cause pain or other problems, such as vaginal bleeding. Document Released: 11/06/2008 Document Revised: 04/10/2014 Document Reviewed: 10/31/2013 ExitCare Patient Information 2015 [employer]. This information is not intended to replace advice given to you by your health care provider. Make sure you discuss any questions you have with your health care provider.  

## 2014-07-30 NOTE — Progress Notes (Signed)
Encounter reviewed by Dr. Brook Silva.  

## 2014-07-31 ENCOUNTER — Encounter (INDEPENDENT_AMBULATORY_CARE_PROVIDER_SITE_OTHER): Payer: Self-pay

## 2014-07-31 ENCOUNTER — Encounter: Payer: Self-pay | Admitting: Family Medicine

## 2014-07-31 ENCOUNTER — Ambulatory Visit (INDEPENDENT_AMBULATORY_CARE_PROVIDER_SITE_OTHER): Payer: BC Managed Care – PPO | Admitting: Family Medicine

## 2014-07-31 VITALS — BP 118/80 | HR 54 | Temp 98.2°F | Ht 68.0 in | Wt 182.2 lb

## 2014-07-31 DIAGNOSIS — F329 Major depressive disorder, single episode, unspecified: Secondary | ICD-10-CM

## 2014-07-31 DIAGNOSIS — F32A Depression, unspecified: Secondary | ICD-10-CM

## 2014-07-31 NOTE — Progress Notes (Signed)
Pre visit review using our clinic review tool, if applicable. No additional management support is needed unless otherwise documented below in the visit note. 

## 2014-07-31 NOTE — Progress Notes (Signed)
   Dr. Karleen Hampshire T. Dilana Mcphie, MD, CAQ Sports Medicine Primary Care and Sports Medicine [address] [place], [address] Phone: [phone] Fax: [phone]  07/31/2014  Patient: Elizabeth Harding, MRN: [id number], DOB: Nov 26, 1986  Primary Physician:  Hannah Beat, MD  Chief Complaint: Follow-up  Subjective:   Elizabeth Harding is a 28 y.o. very pleasant female patient who presents with the following:  Feeling. Was taking it at night, then started taking it first. Doing much better. 5 weeks into on lexapro. Still going to counselor. NO SI or HI.  06/21/2014 Last OV with Hannah Beat, MD   March got married, and seeing a Haematologist now. Seeing Ival Bible.   Saw parents for the first time into   Going on since September. Got out of school. Crying some at home. Not sleeping as well. Waking up more at night. Sleeping longer. 8-9 hours a night. Napping in the afternoon. Decreased energy.  Anhedonia. No guilty feelings.   Some irritable.  Husband went to counselling.  He would like to help.   Not much exercise.    Past Medical History, Surgical History, Social History, Family History, Problem List, Medications, and Allergies have been reviewed and updated if relevant.   GEN: No acute illnesses, no fevers, chills. GI: No n/v/d, eating normally Pulm: No SOB Interactive and getting along well at home.  Otherwise, ROS is as per the HPI.  Objective:   BP 118/80  Pulse 54  Temp(Src) 98.2 F (36.8 C) (Oral)  Ht  (1.727 m)  Wt 182 lb 4 oz (82.668 kg)  BMI 27.72 kg/m2  LMP 07/30/2014  GEN: WDWN, NAD, Non-toxic, A & O x 3 HEENT: Atraumatic, Normocephalic. Neck supple. No masses, No LAD. Ears and Nose: No external deformity. CV: RRR, No M/G/R. No JVD. No thrill. No extra heart sounds. PULM: CTA B, no wheezes, crackles, rhonchi. No retractions. No resp. distress. No accessory muscle use. EXTR: No c/c/e NEURO Normal gait.  PSYCH: Normally interactive. Conversant.  Not depressed or anxious appearing.  Calm demeanor.   Laboratory and Imaging Data:  Assessment and Plan:   Acute depression  Rec taper off lexapro - stay on 6 mo if possible.   If preg, then rev taper  Signed,  Zebastian Carico T. Yaniyah Koors, MD   Patient's Medications  New Prescriptions   No medications on file  Previous Medications   ALBUTEROL (PROAIR HFA) 108 (90 BASE) MCG/ACT INHALER    Inhale 2 puffs into the lungs every 6 (six) hours as needed for wheezing.   ESCITALOPRAM (LEXAPRO) 10 MG TABLET    Take 1 tablet (10 mg total) by mouth daily.  Modified Medications   No medications on file  Discontinued Medications   NORETHINDRONE-ETHINYL ESTRADIOL (JUNEL FE 1/20) 1-20 MG-MCG TABLET    TAKE 1 TABLET BY MOUTH EVERY DAY

## 2014-12-25 ENCOUNTER — Telehealth: Payer: Self-pay | Admitting: Nurse Practitioner

## 2014-12-25 NOTE — Telephone Encounter (Signed)
Left message to call Kaitlyn at [phone]. 

## 2014-12-25 NOTE — Telephone Encounter (Signed)
Pt states she had a positive pregnancy test on Friday. Miss Alexia Freestoneatty told her recently when this happens to plan to come see her. Pt would like to schedule appointment for pregnancy confirmation.

## 2014-12-25 NOTE — Telephone Encounter (Signed)
Spoke with patient. She states LMP is 11/18/14 but patient unsure about dates.  Denies problems, no bleeding or pain.   Requests late afternoon appointment, she is a Runner, broadcasting/film/videoteacher.  Appointment for 12/29/14 scheduled with Lauro FranklinPatricia Rolen-Grubb, FNP. Advised to call with any concerns prior, she is agreeable.   Routing to provider for final review. Patient agreeable to disposition. Will close encounter

## 2014-12-28 ENCOUNTER — Encounter: Payer: Self-pay | Admitting: Nurse Practitioner

## 2014-12-28 ENCOUNTER — Ambulatory Visit (INDEPENDENT_AMBULATORY_CARE_PROVIDER_SITE_OTHER): Payer: BC Managed Care – PPO | Admitting: Nurse Practitioner

## 2014-12-28 VITALS — BP 120/64 | HR 64 | Ht 68.0 in | Wt 191.0 lb

## 2014-12-28 DIAGNOSIS — N912 Amenorrhea, unspecified: Secondary | ICD-10-CM

## 2014-12-28 LAB — POCT URINE PREGNANCY: PREG TEST UR: POSITIVE

## 2014-12-28 NOTE — Progress Notes (Signed)
Patient ID: Elizabeth Harding, female   DOB: 1986-05-27, 29 y.o.   MRN: [id number] S: This 29 yo MW Fe G0 presents for a consultation visit to discuss + UPT 12/22/14.  She has been off OCP since September 2015 trying for a pregnancy.  Her cycles have been normal since off OCP X 4 months. Her LMP was about 11/18/14. She and her husband came in for preconceptual counseling 07/24/14.  She has been on Prenatal MVI.  Immunizations are UTD.  She feels well without bleeding or pain.  She has experienced some lower abdominal 'fullness' and increase in urination without dysuria.  Some fatigue. She and husband are very pleased.  She has already tapered the Lexapro through PCP and is now down to 5 mg every third day.  O: UPT: positive  No distress, feels well  A: Amenorrhea with positive UPT  Plan: Will schedule PUS for confirmation of viable pregnancy and follow  Reviewed list of OB/ GYN  Consult time:  15 minutes face to face

## 2014-12-29 ENCOUNTER — Ambulatory Visit: Payer: Self-pay | Admitting: Nurse Practitioner

## 2015-01-01 ENCOUNTER — Encounter: Payer: Self-pay | Admitting: Nurse Practitioner

## 2015-01-02 NOTE — Progress Notes (Signed)
Reviewed personally.  M. Suzanne Dillon Mcreynolds, MD.  

## 2015-01-03 ENCOUNTER — Telehealth: Payer: Self-pay | Admitting: Nurse Practitioner

## 2015-01-03 NOTE — Telephone Encounter (Addendum)
Patient calling to speak with nurse about whether she can do Zumba or not while she is pregnant.

## 2015-01-03 NOTE — Telephone Encounter (Signed)
Spoke with patient. Patient would like to know if she can continue with Zumba during pregnancy. Advised patient may continue with Zumba. Advised would not want patient to do anything that can risk her health or babies health. Will need to consult with OBGYN as she gets further into pregnancy to see if any modifications need to be made to workouts. Advised may continue with Zumba at this time. Patient would also like to know if she can get a massage. Advised this will be fine. Patient is agreeable.  Routing to provider for final review. Patient agreeable to disposition. Will close encounter

## 2015-01-04 ENCOUNTER — Telehealth: Payer: Self-pay | Admitting: Nurse Practitioner

## 2015-01-04 ENCOUNTER — Other Ambulatory Visit: Payer: Self-pay | Admitting: Nurse Practitioner

## 2015-01-04 MED ORDER — DOXYLAMINE-PYRIDOXINE 10-10 MG PO TBEC: 1.0000 | DELAYED_RELEASE_TABLET | Freq: Every evening | ORAL | Status: DC | PRN

## 2015-01-04 NOTE — Telephone Encounter (Signed)
Spoke with patient. Patient states that for the last two days she has been experiencing morning sickness and nausea throughout the day. "The morning is the worst. I have tried to eat when I wake up because eating throughout the day helps but it comes right back up. I was unable to go to work today because it was so bad." Advised patient will send a message over to AshlandPatricia Rolen-Grubb, FNP and return call with further recommendations. Patient is using pharmacy on file.

## 2015-01-04 NOTE — Telephone Encounter (Signed)
Spoke with patient. Advised patient Elizabeth FranklinPatricia Rolen-Grubb, FNP has sent over rx for patient to pharmacy of choice. Patient is agreeable.  Routing to provider for final review. Patient agreeable to disposition. Will close encounter

## 2015-01-04 NOTE — Telephone Encounter (Signed)
Will send rx for nausea to her pharmacy

## 2015-01-04 NOTE — Telephone Encounter (Signed)
Pt wanting to know what she can take for morning sickness and nausea.

## 2015-01-05 ENCOUNTER — Telehealth: Payer: Self-pay | Admitting: Obstetrics & Gynecology

## 2015-01-05 DIAGNOSIS — O3680X1 Pregnancy with inconclusive fetal viability, fetus 1: Secondary | ICD-10-CM

## 2015-01-05 NOTE — Telephone Encounter (Signed)
Spoke with patient. Advised that per benefit quote received, she will be responsible to pay $462.38 when she comes in for PUS. Patient agreeable. °Scheduled PUS. °Advised patient of 72 hour cancellation policy and $100 cancellation fee. Patient agreeable. °

## 2015-01-18 ENCOUNTER — Ambulatory Visit (INDEPENDENT_AMBULATORY_CARE_PROVIDER_SITE_OTHER): Payer: BC Managed Care – PPO

## 2015-01-18 ENCOUNTER — Ambulatory Visit (INDEPENDENT_AMBULATORY_CARE_PROVIDER_SITE_OTHER): Payer: BC Managed Care – PPO | Admitting: Obstetrics & Gynecology

## 2015-01-18 ENCOUNTER — Other Ambulatory Visit: Payer: Self-pay | Admitting: Obstetrics & Gynecology

## 2015-01-18 VITALS — BP 120/76 | Ht 68.0 in | Wt 184.0 lb

## 2015-01-18 DIAGNOSIS — N912 Amenorrhea, unspecified: Secondary | ICD-10-CM

## 2015-01-18 DIAGNOSIS — O3680X1 Pregnancy with inconclusive fetal viability, fetus 1: Secondary | ICD-10-CM

## 2015-01-18 LAB — US OB TRANSVAGINAL

## 2015-01-18 NOTE — Progress Notes (Signed)
29 y.o. G1 MWF with + UPT on 12/22/14 and LMP 11/18/14 here for viability ultrasound.  EDC by LMP 08/11/15.  Pt having a lot of nausea but pressure bracelets are helping.  Using Diclegis as well with success.  Hasn't thrown up.  Denies VB or pelvic pain.  Also reports breast tenderness and fatigue.  Reassured pt all of these symptoms are normal.  Pt and spouse here together.  They are very excited and have a lot of questions.    FINDINGS: UTERUS: single IUP noted with normal gestational sac, yolk sac, CRL 1.86.  C/W dating by LMP.  FCA noted at  [facility]:   Left ovary 4.4 x 2.8cm with 2.8cm corpus luteal cyst   Right ovary 2.8 x 1.4cm  CUL DE SAC: no free fluid Cervical length:  3.1cm  Reviewed images with pt.  EDG 08/27/15 by ultrasound today consistent with EDC by LMP as well.    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  Cervix closed and 3.1cm today.  Reviewed with pt no changing kitty litter.  Reports no cats in the home.   Tdap 05/24/10.  Had chicken pox.  Aware flu vaccine safe and recommended.   Fish/shellfish/mercury discuss.  Patient does not eat fish.  Unpasteurized cheese/juices discussed.  Nitrites in foods disucssed.  Exercise and intercourse discussed. ssed.  Cystic fibrosis discussed.  They will review all of these to be Fetal DNA particle testing discussed.  First trimester down's testing discu better informed with new ob visit.  Pt aware transfer of care now appropriate.  They will consider options and let us know when NOB appt is scheduled for transfer of records.  All questions answered.  Assessment:  Viable singleton IUD noted c/w dating by LMP Plan: Transfer of care is appropriate now.  ~25 minutes spent with patient >50% of time was in face to face discussion of above.   ADDENDUM:

## 2015-01-21 ENCOUNTER — Encounter: Payer: Self-pay | Admitting: Obstetrics & Gynecology

## 2015-01-25 ENCOUNTER — Ambulatory Visit: Payer: BC Managed Care – PPO | Admitting: Nurse Practitioner

## 2015-01-25 NOTE — Telephone Encounter (Signed)
Records faxed through epic. Will close encounter.

## 2015-01-25 NOTE — Telephone Encounter (Signed)
Patient wants to have her confirmation of pregnancy records sent to [facility].

## 2015-05-25 ENCOUNTER — Telehealth: Payer: Self-pay | Admitting: *Deleted

## 2015-05-25 NOTE — Telephone Encounter (Signed)
08 Pap recall due 01/2015 due to history of CIN-II with LEEP in 2012  Past History:   01/23/14 Pap, negative  12/25/11 Pap, normal per patient  Pt is currently pregnant with an EDC of 08/31/15.  Please advise recall.

## 2015-05-25 NOTE — Telephone Encounter (Signed)
Out of Pap recall due to transfer of care.

## 2015-05-28 NOTE — Telephone Encounter (Signed)
Pt removed from current recall.   Encounter closed. 

## 2015-06-06 ENCOUNTER — Other Ambulatory Visit: Payer: Self-pay

## 2015-06-06 MED ORDER — ALBUTEROL SULFATE HFA 108 (90 BASE) MCG/ACT IN AERS: 2.0000 | INHALATION_SPRAY | Freq: Four times a day (QID) | RESPIRATORY_TRACT | Status: DC | PRN

## 2015-06-06 NOTE — Telephone Encounter (Signed)
Pt left v/m requesting refill proair inhaler to [facility] [address]. Pt last seen f/u appt 07/31/14 and rx last refilled 2014. No future appts scheduled. Please advise.

## 2015-12-09 NOTE — L&D Delivery Note (Signed)
Delivery Note  First Stage: Labor onset: 0045 with SROM SROM at 0045 - clear  Second Stage: Complete dilation at 1350 Onset of pushing at 1400 FHR second stage category 2  Delivery of a viable female at [id number] by CNM in LOA position no nuchal cord Cord double clamped after cessation of pulsation, cut by FOB Cord blood sample collected   Collection of cord blood donation completed  Third Stage: [facility] @ 1539 Placenta disposition: hospital Uterine tone firm / bleeding moderate  2nd laceration identified  Anesthesia for repair: 1% lidocaine local Repair 3-0 vicryl perineal muscle / 4-0 vicryl subcuticular Est. Blood Loss (mL): 150  Complications:   Mom to postpartum.  Baby to Couplet care / Skin to Skin.  Newborn: Birth Weight: 8 pound and 5 ounces Apgar Scores: 9-9 Feeding planned: breast  Elizabeth MikeBAILEY, Elizabeth Harding CNM, MSN, FACNM 02/27/2016, 4:04 PM

## 2015-12-24 ENCOUNTER — Inpatient Hospital Stay (HOSPITAL_COMMUNITY): Payer: No Typology Code available for payment source

## 2015-12-24 ENCOUNTER — Encounter (HOSPITAL_COMMUNITY): Payer: Self-pay

## 2015-12-24 ENCOUNTER — Inpatient Hospital Stay (HOSPITAL_COMMUNITY)
Admission: AD | Admit: 2015-12-24 | Discharge: 2015-12-25 | Disposition: A | Discharge status: Home / Self Care | Payer: No Typology Code available for payment source | Source: Ambulatory Visit | Attending: Obstetrics & Gynecology | Admitting: Obstetrics & Gynecology

## 2015-12-24 DIAGNOSIS — T1490 Injury, unspecified: Secondary | ICD-10-CM | POA: Diagnosis present

## 2015-12-24 DIAGNOSIS — Z3A30 30 weeks gestation of pregnancy: Secondary | ICD-10-CM | POA: Diagnosis not present

## 2015-12-24 DIAGNOSIS — O26893 Other specified pregnancy related conditions, third trimester: Secondary | ICD-10-CM | POA: Insufficient documentation

## 2015-12-24 DIAGNOSIS — O9A213 Injury, poisoning and certain other consequences of external causes complicating pregnancy, third trimester: Secondary | ICD-10-CM

## 2015-12-24 HISTORY — DX: Injury, poisoning and certain other consequences of external causes complicating pregnancy, third trimester: O9A.213

## 2015-12-24 LAB — OB RESULTS CONSOLE HEPATITIS B SURFACE ANTIGEN: Hepatitis B Surface Ag: NEGATIVE

## 2015-12-24 LAB — OB RESULTS CONSOLE ABO/RH: RH TYPE: NEGATIVE

## 2015-12-24 LAB — OB RESULTS CONSOLE RPR: RPR: NONREACTIVE

## 2015-12-24 LAB — OB RESULTS CONSOLE ANTIBODY SCREEN: Antibody Screen: NEGATIVE

## 2015-12-24 LAB — OB RESULTS CONSOLE RUBELLA ANTIBODY, IGM: Rubella: IMMUNE

## 2015-12-24 LAB — OB RESULTS CONSOLE HIV ANTIBODY (ROUTINE TESTING): HIV: NONREACTIVE
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
## 2016-02-11 LAB — OB RESULTS CONSOLE GBS: GBS: POSITIVE

## 2016-02-27 ENCOUNTER — Encounter (HOSPITAL_COMMUNITY): Payer: Self-pay | Admitting: *Deleted

## 2016-02-27 ENCOUNTER — Inpatient Hospital Stay (HOSPITAL_COMMUNITY)
Admission: AD | Admit: 2016-02-27 | Discharge: 2016-02-29 | DRG: 775 | Disposition: A | Discharge status: Home / Self Care | Payer: BC Managed Care – PPO | Source: Ambulatory Visit | Attending: Obstetrics and Gynecology | Admitting: Obstetrics and Gynecology

## 2016-02-27 DIAGNOSIS — J45909 Unspecified asthma, uncomplicated: Secondary | ICD-10-CM | POA: Diagnosis present

## 2016-02-27 DIAGNOSIS — Z6791 Unspecified blood type, Rh negative: Secondary | ICD-10-CM | POA: Diagnosis not present

## 2016-02-27 DIAGNOSIS — O9952 Diseases of the respiratory system complicating childbirth: Secondary | ICD-10-CM | POA: Diagnosis present

## 2016-02-27 DIAGNOSIS — Z3A39 39 weeks gestation of pregnancy: Secondary | ICD-10-CM | POA: Diagnosis not present

## 2016-02-27 DIAGNOSIS — O9A213 Injury, poisoning and certain other consequences of external causes complicating pregnancy, third trimester: Secondary | ICD-10-CM

## 2016-02-27 DIAGNOSIS — O4202 Full-term premature rupture of membranes, onset of labor within 24 hours of rupture: Secondary | ICD-10-CM | POA: Diagnosis present

## 2016-02-27 DIAGNOSIS — O99824 Streptococcus B carrier state complicating childbirth: Secondary | ICD-10-CM | POA: Diagnosis present

## 2016-02-27 DIAGNOSIS — O26893 Other specified pregnancy related conditions, third trimester: Secondary | ICD-10-CM | POA: Diagnosis present

## 2016-02-27 DIAGNOSIS — Z8249 Family history of ischemic heart disease and other diseases of the circulatory system: Secondary | ICD-10-CM

## 2016-02-27 LAB — POCT FERN TEST: POCT Fern Test: POSITIVE

## 2016-02-27 LAB — CBC
HCT: 36.6 % (ref 36.0–46.0)
Hemoglobin: 12.5 g/dL (ref 12.0–15.0)
MCH: 29.2 pg (ref 26.0–34.0)
MCHC: 34.2 g/dL (ref 30.0–36.0)
MCV: 85.5 fL (ref 78.0–100.0)
PLATELETS: 314 10*3/uL (ref 150–400)
RBC: 4.28 MIL/uL (ref 3.87–5.11)
RDW: 15.7 % — AB (ref 11.5–15.5)
WBC: 10.9 10*3/uL — ABNORMAL HIGH (ref 4.0–10.5)

## 2016-02-27 LAB — TYPE AND SCREEN
ABO/RH(D): O NEG
Antibody Screen: NEGATIVE

## 2016-02-27 LAB — ABO/RH: ABO/RH(D): O NEG

## 2016-02-27 LAB — RPR: RPR: NONREACTIVE

## 2016-02-27 MED ORDER — BENZOCAINE-MENTHOL 20-0.5 % EX AERO
1.0000 "application " | INHALATION_SPRAY | CUTANEOUS | Status: DC | PRN
  Administered 2016-02-27 – 2016-02-29 (×2): 1 via TOPICAL
  Filled 2016-02-27 (×2): qty 56

## 2016-02-27 MED ORDER — OXYCODONE-ACETAMINOPHEN 5-325 MG PO TABS: 2.0000 | ORAL_TABLET | ORAL | Status: DC | PRN

## 2016-02-27 MED ORDER — OXYCODONE HCL 5 MG PO TABS: 10.0000 mg | ORAL_TABLET | ORAL | Status: DC | PRN

## 2016-02-27 MED ORDER — SIMETHICONE 80 MG PO CHEW: 80.0000 mg | CHEWABLE_TABLET | ORAL | Status: DC | PRN

## 2016-02-27 MED ORDER — ONDANSETRON HCL 4 MG/2ML IJ SOLN
4.0000 mg | Freq: Four times a day (QID) | INTRAMUSCULAR | Status: DC | PRN
  Administered 2016-02-27: 4 mg via INTRAVENOUS
  Filled 2016-02-27: qty 2

## 2016-02-27 MED ORDER — ACETAMINOPHEN 325 MG PO TABS: 650.0000 mg | ORAL_TABLET | ORAL | Status: DC | PRN

## 2016-02-27 MED ORDER — OXYTOCIN BOLUS FROM INFUSION: 500.0000 mL | INTRAVENOUS | Status: DC

## 2016-02-27 MED ORDER — CITRIC ACID-SODIUM CITRATE 334-500 MG/5ML PO SOLN: 30.0000 mL | ORAL | Status: DC | PRN

## 2016-02-27 MED ORDER — FLEET ENEMA 7-19 GM/118ML RE ENEM: 1.0000 | ENEMA | RECTAL | Status: DC | PRN

## 2016-02-27 MED ORDER — OXYCODONE-ACETAMINOPHEN 5-325 MG PO TABS: 1.0000 | ORAL_TABLET | ORAL | Status: DC | PRN

## 2016-02-27 MED ORDER — MAGNESIUM 400 MG PO CAPS: 400.0000 mg | ORAL_CAPSULE | Freq: Every day | ORAL | Status: DC

## 2016-02-27 MED ORDER — LIDOCAINE HCL (PF) 1 % IJ SOLN
30.0000 mL | INTRAMUSCULAR | Status: DC | PRN
  Filled 2016-02-27: qty 30

## 2016-02-27 MED ORDER — DIBUCAINE 1 % RE OINT: 1.0000 "application " | TOPICAL_OINTMENT | RECTAL | Status: DC | PRN

## 2016-02-27 MED ORDER — OXYTOCIN 10 UNIT/ML IJ SOLN
2.5000 [IU]/h | INTRAVENOUS | Status: DC
  Filled 2016-02-27: qty 10

## 2016-02-27 MED ORDER — WITCH HAZEL-GLYCERIN EX PADS: 1.0000 "application " | MEDICATED_PAD | CUTANEOUS | Status: DC | PRN

## 2016-02-27 MED ORDER — LACTATED RINGERS IV SOLN
INTRAVENOUS | Status: DC
  Administered 2016-02-27: 06:00:00 via INTRAVENOUS

## 2016-02-27 MED ORDER — PENICILLIN G POTASSIUM 5000000 UNITS IJ SOLR
2.5000 10*6.[IU] | INTRAVENOUS | Status: DC
  Administered 2016-02-27: 2.5 10*6.[IU] via INTRAVENOUS
  Filled 2016-02-27 (×7): qty 2.5

## 2016-02-27 MED ORDER — MAGNESIUM OXIDE 400 (241.3 MG) MG PO TABS
400.0000 mg | ORAL_TABLET | Freq: Every day | ORAL | Status: DC
  Administered 2016-02-28 – 2016-02-29 (×2): 400 mg via ORAL
  Filled 2016-02-27 (×4): qty 1

## 2016-02-27 MED ORDER — DEXTROSE 5 % IV SOLN
5.0000 10*6.[IU] | Freq: Once | INTRAVENOUS | Status: AC
  Administered 2016-02-27: 5 10*6.[IU] via INTRAVENOUS
  Filled 2016-02-27: qty 5

## 2016-02-27 MED ORDER — LACTATED RINGERS IV SOLN: 500.0000 mL | INTRAVENOUS | Status: DC | PRN

## 2016-02-27 MED ORDER — LANOLIN HYDROUS EX OINT: TOPICAL_OINTMENT | CUTANEOUS | Status: DC | PRN

## 2016-02-27 MED ORDER — IBUPROFEN 600 MG PO TABS
600.0000 mg | ORAL_TABLET | Freq: Four times a day (QID) | ORAL | Status: DC
  Administered 2016-02-27 – 2016-02-29 (×8): 600 mg via ORAL
  Filled 2016-02-27 (×8): qty 1

## 2016-02-27 MED ORDER — OXYCODONE HCL 5 MG PO TABS: 5.0000 mg | ORAL_TABLET | ORAL | Status: DC | PRN

## 2016-02-27 NOTE — Progress Notes (Signed)
S:  Ctx painful - tolerating labor well  O:  VS: Blood pressure 118/64, pulse 65, temperature 98.1 F (36.7 C), temperature source Oral, resp. rate 18, height 5\' 8"  (1.727 m), weight 97.07 kg (214 lb), last menstrual period 11/24/2014, unknown if currently breastfeeding.        FHR : baseline 130 / variability moderate / accelerations + / occasional variable decelerations        Toco: contractions every 2-3 minutes / moderate - strong         Cervix : 8cm / 90% / vtx 0 station LOP        Membranes: clear with bloody show - forewaters broke in bathroom  A: Active labor     FHR category 2  P: expectant management     Marlinda MikeBAILEY, Munirah Doerner CNM, MSN, [facility] 02/27/2016, 1:07 PM

## 2016-02-27 NOTE — Progress Notes (Signed)
CNM Bailey in room
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
## 2016-02-27 NOTE — Lactation Note (Signed)
This note was copied from a baby's chart. Lactation Consultation Note  Patient Name: Elizabeth Harding WUJWJ'XToday's Date: 02/27/2016 Reason for consult: Initial assessment Baby at 6 hr of life and mom reports baby is latching well. She denies breast or nipple pain. She stated she has flat nipples and tried the Harmony to get them more erect but it hurt. She would rather try to manually stimulate her nipples when latching than use a pump. She stated she can manually express, has seen colostrum bilaterally, and has a spoon in the room. Discussed baby behavior, feeding frequency, baby belly size, voids, wt loss, breast changes, and nipple care. Given lactation handouts. Aware of OP services and support group.     Maternal Data Has patient been taught Hand Expression?: Yes Does the patient have breastfeeding experience prior to this delivery?: No  Feeding Feeding Type: Breast Fed Length of feed: 0 min  LATCH Score/Interventions Latch: Repeated attempts needed to sustain latch, nipple held in mouth throughout feeding, stimulation needed to elicit sucking reflex.  Audible Swallowing: None  Type of Nipple: Everted at rest and after stimulation (short shaft) Intervention(s): Hand pump  Comfort (Breast/Nipple): Soft / non-tender     Hold (Positioning): Assistance needed to correctly position infant at breast and maintain latch.  LATCH Score: 6  Lactation Tools Discussed/Used WIC Program: No   Consult Status Consult Status: Follow-up Date: 02/28/16 Follow-up type: In-patient    Rulon Eisenmengerlizabeth E Nishan Ovens 02/27/2016, 10:07 PM

## 2016-02-27 NOTE — Progress Notes (Signed)
Pt refuses to change position

## 2016-02-27 NOTE — MAU Note (Signed)
Pt stated her water broke at 0045 Clear fluid. . Started having ctx at 145. About 6 min apart. Good fetal movement reported.

## 2016-02-28 LAB — CBC
HCT: 31.3 % — ABNORMAL LOW (ref 36.0–46.0)
Hemoglobin: 10.2 g/dL — ABNORMAL LOW (ref 12.0–15.0)
MCH: 28.4 pg (ref 26.0–34.0)
MCHC: 32.6 g/dL (ref 30.0–36.0)
MCV: 87.2 fL (ref 78.0–100.0)
Platelets: 276 10*3/uL (ref 150–400)
RBC: 3.59 MIL/uL — ABNORMAL LOW (ref 3.87–5.11)
RDW: 16 % — ABNORMAL HIGH (ref 11.5–15.5)
WBC: 15.5 10*3/uL — ABNORMAL HIGH (ref 4.0–10.5)

## 2016-02-28 MED ORDER — RHO D IMMUNE GLOBULIN 1500 UNIT/2ML IJ SOSY
300.0000 ug | PREFILLED_SYRINGE | Freq: Once | INTRAMUSCULAR | Status: AC
  Administered 2016-02-28: 300 ug via INTRAVENOUS
  Filled 2016-02-28: qty 2

## 2016-02-28 NOTE — Lactation Note (Signed)
This note was copied from a baby's chart. Lactation Consultation Note later Patient Name: Boy Leone BrandMeredith Benn JXBJY'NToday's Date: 02/28/2016 Reason for consult: Follow-up assessment;Difficult latch   Maternal Data    Feeding Feeding Type: Breast Fed Length of feed: 0 min  LATCH Score/Interventions Latch: Too sleepy or reluctant, no latch achieved, no sucking elicited. Intervention(s): Skin to skin;Teach feeding cues;Waking techniques Intervention(s): Adjust position;Assist with latch;Breast massage;Breast compression  Audible Swallowing: None  Type of Nipple: Everted at rest and after stimulation Intervention(s): Shells;Double electric pump;Hand pump  Comfort (Breast/Nipple): Filling, red/small blisters or bruises, mild/mod discomfort  Problem noted: Mild/Moderate discomfort Interventions (Mild/moderate discomfort): Comfort gels;Pre-pump if needed;Hand massage;Hand expression  Hold (Positioning): Assistance needed to correctly position infant at breast and maintain latch. Intervention(s): Skin to skin;Position options;Support Pillows;Breastfeeding basics reviewed  LATCH Score: 4  Lactation Tools Discussed/Used Tools: Shells;Pump;Comfort gels Shell Type: Inverted Breast pump type: Double-Electric Breast Pump   Consult Status Consult Status: Follow-up Date: 02/28/16 Follow-up type: In-patient    Vansh Reckart, Diamond NickelLAURA G 02/28/2016, 8:10 AM

## 2016-02-28 NOTE — Progress Notes (Signed)
Patient ID: Elizabeth Harding, female   DOB: 1986-10-10, 30 y.o.   MRN: [id number] PPD # 1 SVD  S:  Reports feeling very tired.             Tolerating po/ No nausea or vomiting             Bleeding is light             Pain controlled with ibuprofen (OTC)             Up ad lib / ambulatory / voiding without difficulties    Newborn  Information for the patient's newborn:  Sherre Scarletwbank, Boy Lillyonna [[id number]][[id number]]  female  breast feeding  / Circumcision planning   O:  A & O x 3, in no apparent distress              VS:  Filed Vitals:   02/27/16 1901 02/28/16 0020 02/28/16 0524 02/28/16 0744  BP: 119/76 118/60 109/64 128/60  Pulse: 101 72 66 64  Temp: 99 F (37.2 C) 98.1 F (36.7 C) 97.7 F (36.5 C) 98.3 F (36.8 C)  TempSrc: Oral   Oral  Resp: 20 18 20 20   Height:      Weight:        LABS:  Recent Labs  02/27/16 0610 02/28/16 0555  WBC 10.9* 15.5*  HGB 12.5 10.2*  HCT 36.6 31.3*  PLT 314 276    Blood type: O NEG (03/22 0610) / Infant Rh POS / Rhophylac indicated  Rubella: Immune (01/16 0000)   I&O: I/O last 3 completed shifts: In: -  Out: 150 [Blood:150]             Lungs: Clear and unlabored  Heart: regular rate and rhythm / no murmurs  Abdomen: soft, non-tender, non-distended             Fundus: firm, non-tender, U-1  Perineum: 2nd degree repair healing well, mild edema - ice pack in place  Lochia: minimal  Extremities: No edema, no calf pain or tenderness, No Homans    A/P: PPD # 1  30 y.o., [id number]   Principal Problem:    Postpartum care following vaginal delivery (3/22)  Active Problems:    Normal labor  Rh Negative   Doing well - stable status  Routine post partum orders  Rhophylac injection prior to d/c  Consider early d/c today - pending pediatrician    Kenard GowerAWSON, Paylin Hailu, M, MSN, CNM 02/28/2016, 8:38 AM

## 2016-02-29 LAB — RH IG WORKUP (INCLUDES ABO/RH)
ABO/RH(D): O NEG
FETAL SCREEN: NEGATIVE
Gestational Age(Wks): 39.5
Unit division: 0

## 2016-02-29 MED ORDER — IBUPROFEN 600 MG PO TABS: 600.0000 mg | ORAL_TABLET | Freq: Four times a day (QID) | ORAL | Status: DC

## 2016-02-29 NOTE — Discharge Summary (Signed)
Obstetric Discharge Summary  Reason for Admission: onset of labor Prenatal Procedures: none Intrapartum Procedures: spontaneous vaginal delivery and GBS prophylaxis Postpartum Procedures: Rho(D) Ig Complications-Operative and Postpartum: 2nd degree perineal laceration HEMOGLOBIN  Date Value Ref Range Status  02/28/2016 10.2* 12.0 - 15.0 g/dL Final   HCT  Date Value Ref Range Status  02/28/2016 31.3* 36.0 - 46.0 % Final    Physical Exam:  General: alert, cooperative and no distress Lochia: appropriate Uterine Fundus: firm Incision: healing well DVT Evaluation: No evidence of DVT seen on physical exam.  Discharge Diagnoses: Term Pregnancy-delivered  Discharge Information: Date: 02/29/2016 Activity: pelvic rest Diet: routine Medications: PNV and Ibuprofen Condition: stable Instructions: refer to practice specific booklet Discharge to: home Follow-up Information    Follow up with Marlinda MikeBAILEY, TANYA, CNM. Schedule an appointment as soon as possible for a visit in 6 weeks.   Specialty:  Obstetrics and Gynecology   Contact information:   [address] [place] [address] [phone]       Newborn Data: Live born female  Birth Weight: 8 lb 5 oz (3771 g) APGAR: 9, 9  Home with mother.  Marlinda MikeBAILEY, TANYA 02/29/2016, 12:18 PM

## 2016-02-29 NOTE — Lactation Note (Signed)
This note was copied from a baby's chart. Lactation Consultation Note Late entry: mom states baby has been sleepy and isn't obtaining deep latch. Discussed postioning, obtaining deep latch, chin tug, hearing swallow, and I&O. New born behavior discussed. Assisted in BF and encouraged mom in posititive actions. Patient Name: Elizabeth Harding: 02/29/2016     Maternal Data    Feeding Feeding Type: Breast Fed  LATCH Score/Interventions                      Lactation Tools Discussed/Used     Consult Status      [name], [name] 02/29/2016, 9:23 AM

## 2016-02-29 NOTE — Lactation Note (Signed)
This note was copied from a baby's chart. Lactation Consultation Note Mom doing well w/BF. Mom states baby is latching great, and denies painful latch. Mom has DEBP at home. [facility] If needed. Baby slightly jaundice, has bruise to top of head lightening. TCB values WDL. Mom wearing shells to evert more and help with sore nipples. Has comfort gels. Reviewed I&O, newborn behavior. Patient Name: Boy Leone BrandMeredith Alumbaugh MVHQI'OToday's Date: 02/29/2016 Reason for consult: Follow-up assessment   Maternal Data    Feeding Feeding Type: Breast Fed Length of feed: 20 min  LATCH Score/Interventions Latch: Grasps breast easily, tongue down, lips flanged, rhythmical sucking.     Type of Nipple: Everted at rest and after stimulation Intervention(s): Shells;Double electric pump  Comfort (Breast/Nipple): Filling, red/small blisters or bruises, mild/mod discomfort  Problem noted: Mild/Moderate discomfort Interventions (Mild/moderate discomfort): Comfort gels;Hand massage;Hand expression  Hold (Positioning): No assistance needed to correctly position infant at breast.     Lactation Tools Discussed/Used Tools: Shells;Pump Shell Type: Inverted Breast pump type: Double-Electric Breast Pump   Consult Status Consult Status: Complete Date: 02/29/16    [name], Ninette Cotta G 02/29/2016, 10:21 AM

## 2016-02-29 NOTE — Progress Notes (Signed)
PPD 2 SVD  S:  Reports feeling well - ready to go home             Tolerating po/ No nausea or vomiting             Bleeding is light             Pain controlled with motrin             Up ad lib / ambulatory / voiding QS  Newborn breastfeeding    O:               VS: BP 112/73 mmHg  Pulse 64  Temp(Src) 97.8 F (36.6 C) (Axillary)  Resp 18  Ht 5\' 8"  (1.727 m)  Wt 97.07 kg (214 lb)  BMI 32.55 kg/m2  SpO2 100%  LMP 11/24/2014 (Exact Date)  Breastfeeding? Unknown   LABS:              Recent Labs  02/27/16 0610 02/28/16 0555  WBC 10.9* 15.5*  HGB 12.5 10.2*  PLT 314 276               Blood type: --/--/O NEG (03/23 0555)  / Rhogam given (newborn RH positive)  Rubella: Immune (01/16 0000)                        Physical Exam:             Alert and oriented X3  Abdomen: soft, non-tender, non-distended              Fundus: firm, non-tender, Ueven  Perineum: mild edema  Lochia: light  Extremities: no edema, no calf pain or tenderness    A: PPD # 2   Doing well - stable status  P: Routine post partum orders  DC home - [facility] booklet - instructions reviewed  Marlinda MikeBAILEY, Son Barkan CNM, MSN, FACNM 02/29/2016, 8:10 AM
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
## 2016-12-08 NOTE — L&D Delivery Note (Signed)
  Delivery Note  First Stage: Labor onset: 0300 Augmentation : none Analgesia /Anesthesia intrapartum: nitrous oxide in transition AROM at 1500  Second Stage: Complete dilation at 1500 Onset of pushing at 1500 FHR second stage 120s intermittent tracing  Delivery of a viable female at 1511 by CNM in LOA position no nuchal cord Cord double clamped after cessation of pulsation, cut by FOB Cord blood sample collected   Third Stage: [facility] with 3 VC @ 1514 Placenta disposition: hospital disposal Uterine tone firm / bleeding small  1st degree laceration identified  Anesthesia for repair: 1% lidocaine local Repair 4-0 vicryl subcuticular Est. Blood Loss (mL): 100  Complications: none  Mom to postpartum.  Baby to Couplet care / Skin to Skin.  Newborn: Birth Weight: 6 pounds and 14.6 ounces Apgar Scores: 9-9 Feeding planned: breast  Marlinda MikeBAILEY, Kristofor Michalowski CNM, MSN, FACNM 08/19/2017, 3:31 PM

## 2017-08-19 ENCOUNTER — Inpatient Hospital Stay (HOSPITAL_COMMUNITY)
Admission: AD | Admit: 2017-08-19 | Discharge: 2017-08-20 | DRG: 775 | Disposition: A | Discharge status: Home / Self Care | Payer: BC Managed Care – PPO | Source: Ambulatory Visit | Attending: Obstetrics and Gynecology | Admitting: Obstetrics and Gynecology

## 2017-08-19 ENCOUNTER — Encounter (HOSPITAL_COMMUNITY): Payer: Self-pay | Admitting: *Deleted

## 2017-08-19 DIAGNOSIS — Z3493 Encounter for supervision of normal pregnancy, unspecified, third trimester: Secondary | ICD-10-CM | POA: Diagnosis present

## 2017-08-19 DIAGNOSIS — O99824 Streptococcus B carrier state complicating childbirth: Principal | ICD-10-CM | POA: Diagnosis present

## 2017-08-19 DIAGNOSIS — Z3A38 38 weeks gestation of pregnancy: Secondary | ICD-10-CM | POA: Diagnosis not present

## 2017-08-19 DIAGNOSIS — Z3490 Encounter for supervision of normal pregnancy, unspecified, unspecified trimester: Secondary | ICD-10-CM

## 2017-08-19 LAB — CBC
HCT: 35.6 % — ABNORMAL LOW (ref 36.0–46.0)
Hemoglobin: 12.1 g/dL (ref 12.0–15.0)
MCH: 30 pg (ref 26.0–34.0)
MCHC: 34 g/dL (ref 30.0–36.0)
MCV: 88.1 fL (ref 78.0–100.0)
PLATELETS: 303 10*3/uL (ref 150–400)
RBC: 4.04 MIL/uL (ref 3.87–5.11)
RDW: 15.5 % (ref 11.5–15.5)
WBC: 12.5 10*3/uL — AB (ref 4.0–10.5)

## 2017-08-19 LAB — OB RESULTS CONSOLE HIV ANTIBODY (ROUTINE TESTING): HIV: NONREACTIVE

## 2017-08-19 LAB — OB RESULTS CONSOLE RUBELLA ANTIBODY, IGM: RUBELLA: IMMUNE

## 2017-08-19 LAB — OB RESULTS CONSOLE HEPATITIS B SURFACE ANTIGEN: Hepatitis B Surface Ag: NEGATIVE

## 2017-08-19 LAB — OB RESULTS CONSOLE RPR: RPR: NONREACTIVE

## 2017-08-19 LAB — OB RESULTS CONSOLE GBS: STREP GROUP B AG: POSITIVE

## 2017-08-19 LAB — TYPE AND SCREEN
ABO/RH(D): O NEG
Antibody Screen: NEGATIVE

## 2017-08-19 MED ORDER — IBUPROFEN 600 MG PO TABS
600.0000 mg | ORAL_TABLET | Freq: Four times a day (QID) | ORAL | Status: DC
  Administered 2017-08-19 – 2017-08-20 (×4): 600 mg via ORAL
  Filled 2017-08-19 (×4): qty 1

## 2017-08-19 MED ORDER — WITCH HAZEL-GLYCERIN EX PADS: 1.0000 "application " | MEDICATED_PAD | CUTANEOUS | Status: DC | PRN

## 2017-08-19 MED ORDER — OXYTOCIN BOLUS FROM INFUSION
500.0000 mL | Freq: Once | INTRAVENOUS | Status: AC
  Administered 2017-08-19: 500 mL via INTRAVENOUS

## 2017-08-19 MED ORDER — ACETAMINOPHEN 325 MG PO TABS
650.0000 mg | ORAL_TABLET | ORAL | Status: DC | PRN
  Administered 2017-08-19: 650 mg via ORAL
  Filled 2017-08-19: qty 2

## 2017-08-19 MED ORDER — OXYCODONE-ACETAMINOPHEN 5-325 MG PO TABS: 2.0000 | ORAL_TABLET | ORAL | Status: DC | PRN

## 2017-08-19 MED ORDER — LACTATED RINGERS IV SOLN: 500.0000 mL | INTRAVENOUS | Status: DC | PRN

## 2017-08-19 MED ORDER — SODIUM CHLORIDE 0.9 % IV SOLN
2.0000 g | Freq: Once | INTRAVENOUS | Status: AC
  Administered 2017-08-19: 2 g via INTRAVENOUS
  Filled 2017-08-19: qty 2000

## 2017-08-19 MED ORDER — COCONUT OIL OIL: 1.0000 "application " | TOPICAL_OIL | Status: DC | PRN

## 2017-08-19 MED ORDER — ONDANSETRON HCL 4 MG/2ML IJ SOLN: 4.0000 mg | Freq: Four times a day (QID) | INTRAMUSCULAR | Status: DC | PRN

## 2017-08-19 MED ORDER — LIDOCAINE HCL (PF) 1 % IJ SOLN
30.0000 mL | INTRAMUSCULAR | Status: DC | PRN
  Administered 2017-08-19: 30 mL via SUBCUTANEOUS
  Filled 2017-08-19: qty 30

## 2017-08-19 MED ORDER — DIBUCAINE 1 % RE OINT: 1.0000 "application " | TOPICAL_OINTMENT | RECTAL | Status: DC | PRN

## 2017-08-19 MED ORDER — ACETAMINOPHEN 325 MG PO TABS
650.0000 mg | ORAL_TABLET | ORAL | Status: DC | PRN
  Administered 2017-08-20: 650 mg via ORAL
  Filled 2017-08-19: qty 2

## 2017-08-19 MED ORDER — SOD CITRATE-CITRIC ACID 500-334 MG/5ML PO SOLN: 30.0000 mL | ORAL | Status: DC | PRN

## 2017-08-19 MED ORDER — OXYTOCIN 40 UNITS IN LACTATED RINGERS INFUSION - SIMPLE MED
2.5000 [IU]/h | INTRAVENOUS | Status: DC
  Filled 2017-08-19: qty 1000

## 2017-08-19 MED ORDER — LACTATED RINGERS IV SOLN
INTRAVENOUS | Status: DC
  Administered 2017-08-19: 12:00:00 via INTRAVENOUS

## 2017-08-19 MED ORDER — SIMETHICONE 80 MG PO CHEW: 80.0000 mg | CHEWABLE_TABLET | ORAL | Status: DC | PRN

## 2017-08-19 MED ORDER — OXYCODONE-ACETAMINOPHEN 5-325 MG PO TABS: 1.0000 | ORAL_TABLET | ORAL | Status: DC | PRN

## 2017-08-19 MED ORDER — BENZOCAINE-MENTHOL 20-0.5 % EX AERO
1.0000 "application " | INHALATION_SPRAY | CUTANEOUS | Status: DC | PRN
  Filled 2017-08-19: qty 56

## 2017-08-19 NOTE — Progress Notes (Signed)
S: ctx painful - coping well / declines analgesia or gas at this time  O:  VS: Blood pressure 124/69, pulse 70, temperature 97.8 F (36.6 C), temperature source Oral, resp. rate 20, height 5\' 7"  (1.702 m), weight 94.8 kg (209 lb), unknown if currently breastfeeding.        FHR : baseline 135 / variability moderate / accelerations + / no decelerations        Toco: contractions every 2-4 minutes / moderate         Cervix : 8cm / 90% / +1 BBOW        Membranes: intact - declines AROM at this time  A: active labor     FHR category 1  P: expectant management   Marlinda MikeBAILEY, Srah Ake CNM, MSN, [facility] 08/19/2017, 1:08 PM
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
## 2017-08-19 NOTE — Anesthesia Pain Management Evaluation Note (Signed)
  CRNA Pain Management Visit Note  Patient: Elizabeth Harding, 31 y.o., female  "Hello [facility] am a [facility]. We have an anesthesia team available at all times to provide care throughout the hospital, including epidural management and anesthesia for C-section. I don't know your plan for the delivery whether it a natural birth, water birth, IV sedation, nitrous supplementation, doula or epidural, but we want to meet your pain goals."   1.Was your pain managed to your expectations on prior hospitalizations? Happy with last experience with no epidural    2.What is your expectation for pain management during this hospitalization?     Nitrous Oxide if needed  3.How can we help you reach that goal? Does not want epidural. Patient aware she can change her mind and get one if desired.  Record the patient's initial score and the patient's pain goal.   Pain: 7  Pain Goal: 10 [facility] wants you to be able to say your pain was always managed very well.  Jeannett Dekoning 08/19/2017

## 2017-08-20 LAB — CBC
HCT: 30.2 % — ABNORMAL LOW (ref 36.0–46.0)
Hemoglobin: 10.3 g/dL — ABNORMAL LOW (ref 12.0–15.0)
MCH: 30.5 pg (ref 26.0–34.0)
MCHC: 34.1 g/dL (ref 30.0–36.0)
MCV: 89.3 fL (ref 78.0–100.0)
Platelets: 242 10*3/uL (ref 150–400)
RBC: 3.38 MIL/uL — ABNORMAL LOW (ref 3.87–5.11)
RDW: 16 % — ABNORMAL HIGH (ref 11.5–15.5)
WBC: 10.6 10*3/uL — ABNORMAL HIGH (ref 4.0–10.5)

## 2017-08-20 LAB — RPR: RPR Ser Ql: NONREACTIVE

## 2017-08-20 MED ORDER — IBUPROFEN 600 MG PO TABS: 600.0000 mg | ORAL_TABLET | Freq: Four times a day (QID) | ORAL | 0 refills | Status: DC

## 2017-08-20 MED ORDER — RHO D IMMUNE GLOBULIN 1500 UNIT/2ML IJ SOSY
300.0000 ug | PREFILLED_SYRINGE | Freq: Once | INTRAMUSCULAR | Status: AC
Start: 2017-08-20 — End: 2017-08-20
  Administered 2017-08-20: 300 ug via INTRAMUSCULAR
  Filled 2017-08-20: qty 2

## 2017-08-20 NOTE — Discharge Summary (Signed)
Obstetric Discharge Summary Reason for Admission: onset of labor Prenatal Procedures: none Intrapartum Procedures: spontaneous vaginal delivery and GBS prophylaxis Postpartum Procedures: none Complications-Operative and Postpartum: 1st degree perineal laceration Hemoglobin  Date Value Ref Range Status  08/20/2017 10.3 (L) 12.0 - 15.0 g/dL Final   Hemoglobin, fingerstick  Date Value Ref Range Status  01/24/2014 14.8 12.0 - 16.0 g/dL Final   HCT  Date Value Ref Range Status  08/20/2017 30.2 (L) 36.0 - 46.0 % Final    Physical Exam:  General: alert, cooperative and no distress Lochia: appropriate Uterine Fundus: firm Incision: healing well DVT Evaluation: No evidence of DVT seen on physical exam.  Discharge Diagnoses: Term Pregnancy-delivered  Discharge Information: Date: 08/20/2017 Activity: pelvic rest Diet: routine Medications: PNV and Ibuprofen Condition: stable Instructions: refer to practice specific booklet Discharge to: home Follow-up Information    Marlinda MikeBailey, Yolande Skoda, CNM. Schedule an appointment as soon as possible for a visit in 2 week(s).   Specialty:  Obstetrics and Gynecology Contact information: [address] [place] [address] [phone]           Newborn Data: Live born female  Birth Weight: 6 lb 14.6 oz (3135 g) APGAR: 9, 9  Home with mother.  Marlinda MikeBAILEY, Evagelia Knack 08/20/2017, 8:31 AM

## 2017-08-20 NOTE — Progress Notes (Signed)
PPD 1 SVD with 1st degree repair  S:  Reports feeling great and wants to go home             Tolerating po/ No nausea or vomiting             Bleeding is light             Pain controlled withMotrin only             Up ad lib / ambulatory / voiding QS  Newborn breast feeding  / female  O:  VS: BP (!) 106/56 (BP Location: Right Arm)   Pulse 62   Temp 98.3 F (36.8 C) (Oral)   Resp 18   Ht 5\' 7"  (1.702 m)   Wt 94.8 kg (209 lb)   SpO2 98%   Breastfeeding? Unknown   BMI 32.73 kg/m    LABS:              Recent Labs  08/19/17 1123 08/20/17 0542  WBC 12.5* 10.6*  HGB 12.1 10.3*  PLT 303 242               Blood type: --/--/O NEG (09/12 1123) / newborn O positive - Rhogam ordered  Rubella: Immune (09/12 1032)               tdap given July 2018                          Physical Exam:             Alert and oriented X3  Abdomen: soft, non-tender, non-distended              Fundus: firm, non-tender, Ueven  Perineum: no edema  Lochia: light  Extremities: no edema, no calf pain or tenderness    A: PPD # 1 SVD with 1st degree repair             Rhogam candidate (newborn RH positive)   Doing well - stable status  P: Routine post partum orders  Give rhogam today             DC home  Marlinda MikeBAILEY, Cortez Flippen CNM, MSN, [facility] 08/20/2017, 7:22 AM

## 2017-08-20 NOTE — Lactation Note (Addendum)
This note was copied from a baby's chart. Lactation Consultation Note Experienced BF mom is currently BF her 7118 month old every morning, every night. Mom plans to cont. And tandem BF. Discussed BF baby first then her son. Discussed how her son may want to BF more d/t baby is feeding, or may not want it at all. May be jealous and not want baby to BF on "HIS" mom's breast getting "HIS" milk.  Discussed this is normal and things to do to involve him in baby care and not feel left out.  Reviewed newborn behavior, I&O, cluster feeding. Mom encouraged to feed baby 8-12 times/24 hours and with feeding cues. Encouraged to alternate breast between the two children.  Mom stated baby wasn't hungry at this last feeding. Has BF well, now is spitting up. Explained normal, document, call for assistance if needed. Educated on if baby is choking or needs to use bulb syring.  Encouraged to ask if has questions or needs assistance. Mom stated she enjoyed LC's support groups. WH/LC brochure given w/resources, support groups and LC services.  Patient Name: Elizabeth Harding ZOXWR'UToday's Date: 08/20/2017 Reason for consult: Initial assessment   Maternal Data Has patient been taught Hand Expression?: Yes Does the patient have breastfeeding experience prior to this delivery?: Yes  Feeding Feeding Type: Breast Fed  LATCH Score Latch: Too sleepy or reluctant, no latch achieved, no sucking elicited.  Audible Swallowing: A few with stimulation  Type of Nipple: Everted at rest and after stimulation  Comfort (Breast/Nipple): Soft / non-tender  Hold (Positioning): No assistance needed to correctly position infant at breast.  LATCH Score: 8  Interventions Interventions: Breast feeding basics reviewed;Support pillows;Position options;Skin to skin  Lactation Tools Discussed/Used     Consult Status Consult Status: Follow-up Date: 08/21/17 Follow-up type: In-patient    Mell Mellott, Diamond NickelLAURA G 08/20/2017, 2:16  AM

## 2017-08-20 NOTE — Lactation Note (Signed)
This note was copied from a baby's chart. Lactation Consultation Note Encouraged to massage at intervals during feeding to keep baby interested. Baby has spitty after delivery.  Baby 19 hrs of age. Parents early d/c home. Mom feels confident about early d/c home.  Mom is tandem BF her 4218 months old. Reviewed tandem feeding on admission.  Mom knows to BF baby first and to rotate sides.  Stressed importance of I&O. At 6619 hrs old baby has had 3 stools, 2 voids, 4 emesis. Call MD if noticed decrease in output or disinterest in BF.  Mom has number to OP LC and support groups. Mom loved support groups with her son.  Patient Name: [name] [id number]'WToday's Date: 08/20/2017 Reason for consult: Follow-up assessment   Maternal Data    Feeding Feeding Type: Breast Fed Length of feed: 10 min  LATCH Score Latch: Repeated attempts needed to sustain latch, nipple held in mouth throughout feeding, stimulation needed to elicit sucking reflex.     Type of Nipple: Everted at rest and after stimulation  Comfort (Breast/Nipple): Soft / non-tender  Hold (Positioning): No assistance needed to correctly position infant at breast.     Interventions Interventions: Support pillows;Breast feeding basics reviewed;Position options  Lactation Tools Discussed/Used     Consult Status Consult Status: Complete Date: 08/20/17    [name], Benno Brensinger G 08/20/2017, 10:28 AM

## 2017-08-22 LAB — RH IG WORKUP (INCLUDES ABO/RH)
ABO/RH(D): O NEG
Fetal Screen: NEGATIVE
Gestational Age(Wks): 38
Unit division: 0

## 2019-09-14 ENCOUNTER — Other Ambulatory Visit: Payer: Self-pay | Admitting: *Deleted

## 2019-09-14 DIAGNOSIS — Z20822 Contact with and (suspected) exposure to covid-19: Secondary | ICD-10-CM

## 2019-09-16 LAB — NOVEL CORONAVIRUS, NAA: SARS-CoV-2, NAA: NOT DETECTED

## 2019-12-09 NOTE — L&D Delivery Note (Signed)
Delivery Note  In tub at 1021, out of tub at 1100. Total time in tub: 4 hours.    1045 In room to see patient, reports continuous pelvic pressure before and after contractions. SVE: 10/100/+2, vertex. Effective coached maternal pushing efforts.   Spontaneous vaginal birth of liveborn female infant in right occiput anterior position in waterbirth tub at 1056. Loose nuchal x 1 reduced on perineum. Infant immediately to maternal chest above water. Delayed cord clamping, skin to skin, and three (3) vessel cord. Cord double clamped and cut, infant taken to warmer for further evaluation by receiving nurse and Neonatologist. APGARs 5, 9. Weight: 3650 g (8 pounds 0.8 oz).   Infant stable and returned skin to skin with patient.   Patient assisted from tub at 1100 with moderate support. Spontaneous delivery of intact placenta at 1104. IM pitocin given, see MAR. Second degree perineal laceration repaired with 3-0 vicryl rapide under local anesthesia. Laceration hemostatic and well approximated. Uterus firm. Rubra small. QBL: 355 ml. Vault check completed. Counts correct x 2.   Initiate routine postpartum care and orders. Mom to postpartum.  Baby to Couplet care / Skin to Skin.  FOB and doula present at bedside and overjoyed with the birth of infant.    [name], CNM [facility], [facility] 11/28/2020, 11:45 AM

## 2020-03-05 ENCOUNTER — Ambulatory Visit: Payer: BC Managed Care – PPO | Attending: Internal Medicine

## 2020-03-05 DIAGNOSIS — Z23 Encounter for immunization: Secondary | ICD-10-CM

## 2020-03-05 NOTE — Progress Notes (Signed)
   Covid-19 Vaccination Clinic  Name:  Elizabeth Harding    MRN: [id number] DOB: Jan 01, 1986  03/05/2020  [name] was observed post Covid-19 immunization for 15 minutes without incident. She was provided with Vaccine Information Sheet and instruction to access the V-Safe system.   [name] was instructed to call 911 with any severe reactions post vaccine: Marland Kitchen Difficulty breathing  . Swelling of face and throat  . A fast heartbeat  . A bad rash all over body  . Dizziness and weakness   Immunizations Administered    Name Date Dose VIS Date Route   Pfizer COVID-19 Vaccine 03/05/2020 12:48 PM 0.3 mL 11/18/2019 Intramuscular   Manufacturer: [employer], [employer]   Lot: [id number]   NDC: [address]-9

## 2020-05-15 LAB — OB RESULTS CONSOLE PLATELET COUNT: Platelets: 327

## 2020-05-15 LAB — OB RESULTS CONSOLE HEPATITIS B SURFACE ANTIGEN: Hepatitis B Surface Ag: NEGATIVE

## 2020-05-15 LAB — OB RESULTS CONSOLE HGB/HCT, BLOOD
HCT: 34 (ref 29–41)
Hemoglobin: 11.9

## 2020-05-15 LAB — OB RESULTS CONSOLE RUBELLA ANTIBODY, IGM: Rubella: IMMUNE

## 2020-05-15 LAB — OB RESULTS CONSOLE GC/CHLAMYDIA
Chlamydia: NEGATIVE
Gonorrhea: NEGATIVE

## 2020-05-15 LAB — OB RESULTS CONSOLE HIV ANTIBODY (ROUTINE TESTING): HIV: NONREACTIVE

## 2020-05-15 LAB — OB RESULTS CONSOLE RPR: RPR: NONREACTIVE

## 2020-07-02 ENCOUNTER — Other Ambulatory Visit: Payer: Self-pay | Admitting: Certified Nurse Midwife

## 2020-07-02 ENCOUNTER — Telehealth: Payer: Self-pay | Admitting: Certified Nurse Midwife

## 2020-07-02 DIAGNOSIS — Z3689 Encounter for other specified antenatal screening: Secondary | ICD-10-CM

## 2020-07-02 DIAGNOSIS — Z3A19 19 weeks gestation of pregnancy: Secondary | ICD-10-CM

## 2020-07-02 DIAGNOSIS — Z3492 Encounter for supervision of normal pregnancy, unspecified, second trimester: Secondary | ICD-10-CM

## 2020-07-02 NOTE — Telephone Encounter (Signed)
ThThe pt had her records from [facility]. I called the pt and let her know that we recived her records. The pt was very excited, the pt said she is 18 weeks and that she has a anatomy scan at [facility] this Friday, the pt stated that she is hoping to cancel that. I told the pt I will send a message to the proivder at our practice on order has to be placed. The pt verbally understood I told her that I would be in touch.

## 2020-07-02 NOTE — Telephone Encounter (Signed)
Ultrasound order placed. Thanks, [name], CNM

## 2020-07-02 NOTE — Telephone Encounter (Signed)
The pt had her records from [facility]. I called the pt and let her know that we recived her records. The pt was very excited and

## 2020-07-02 NOTE — Telephone Encounter (Signed)
Spoke to [facility] and she said to call her and set her up for an anatomy scan and a new ob pe with her after. I called the pt and added her on for 8/5.

## 2020-07-09 ENCOUNTER — Encounter: Payer: Self-pay | Admitting: Surgical

## 2020-07-12 ENCOUNTER — Ambulatory Visit (INDEPENDENT_AMBULATORY_CARE_PROVIDER_SITE_OTHER): Payer: BC Managed Care – PPO | Admitting: Certified Nurse Midwife

## 2020-07-12 ENCOUNTER — Ambulatory Visit (INDEPENDENT_AMBULATORY_CARE_PROVIDER_SITE_OTHER): Payer: BC Managed Care – PPO

## 2020-07-12 ENCOUNTER — Encounter: Payer: Self-pay | Admitting: Certified Nurse Midwife

## 2020-07-12 VITALS — BP 92/54 | HR 67 | Wt 181.0 lb

## 2020-07-12 DIAGNOSIS — Z3492 Encounter for supervision of normal pregnancy, unspecified, second trimester: Secondary | ICD-10-CM

## 2020-07-12 DIAGNOSIS — Z3689 Encounter for other specified antenatal screening: Secondary | ICD-10-CM

## 2020-07-12 DIAGNOSIS — O2612 Low weight gain in pregnancy, second trimester: Secondary | ICD-10-CM

## 2020-07-12 DIAGNOSIS — Z3A20 20 weeks gestation of pregnancy: Secondary | ICD-10-CM

## 2020-07-12 DIAGNOSIS — Z3A19 19 weeks gestation of pregnancy: Secondary | ICD-10-CM | POA: Diagnosis not present

## 2020-07-12 DIAGNOSIS — Z6741 Type O blood, Rh negative: Secondary | ICD-10-CM

## 2020-07-12 LAB — POCT URINALYSIS DIPSTICK OB
Bilirubin, UA: NEGATIVE
Blood, UA: NEGATIVE
Glucose, UA: NEGATIVE
Ketones, UA: NEGATIVE
Leukocytes, UA: NEGATIVE
Nitrite, UA: NEGATIVE
POC,PROTEIN,UA: NEGATIVE
Spec Grav, UA: 1.01 (ref 1.010–1.025)
Urobilinogen, UA: 0.2 E.U./dL
pH, UA: 6 (ref 5.0–8.0)

## 2020-07-12 MED ORDER — ALBUTEROL SULFATE HFA 108 (90 BASE) MCG/ACT IN AERS: 2.0000 | INHALATION_SPRAY | Freq: Four times a day (QID) | RESPIRATORY_TRACT | 2 refills | Status: DC | PRN

## 2020-07-12 NOTE — Patient Instructions (Addendum)
Exercise During Pregnancy Exercise is an important part of being healthy for people of all ages. Exercise improves the function of your heart and lungs and helps you maintain strength, flexibility, and a healthy body weight. Exercise also boosts energy levels and elevates mood. Most women should exercise regularly during pregnancy. In rare cases, women with certain medical conditions or complications may be asked to limit or avoid exercise during pregnancy. How does this affect me? Along with maintaining general strength and flexibility, exercising during pregnancy can help:  Keep strength in muscles that are used during labor and childbirth.  Decrease low back pain.  Reduce symptoms of depression.  Control weight gain during pregnancy.  Reduce the risk of needing insulin if you develop diabetes during pregnancy.  Decrease the risk of cesarean delivery.  Speed up your recovery after giving birth. How does this affect my baby? Exercise can help you have a healthy pregnancy. Exercise does not cause premature birth. It will not cause your baby to weigh less at birth. What exercises can I do? Many exercises are safe for you to do during pregnancy. Do a variety of exercises that safely increase your heart and breathing rates and help you build and maintain muscle strength. Do exercises exactly as told by your health care provider. You may do these exercises:  Walking or hiking.  Swimming.  Water aerobics.  Riding a stationary bike.  Strength training.  Modified yoga or Pilates. Tell your instructor that you are pregnant. Avoid overstretching, and avoid lying on your back for long periods of time.  Running or jogging. Only choose this type of exercise if you: ? Ran or jogged regularly before your pregnancy. ? Can run or jog and still talk in complete sentences. What exercises should I avoid? Depending on your level of fitness and whether you exercised regularly before your  pregnancy, you may be told to limit high-intensity exercise. You can tell that you are exercising at a high intensity if you are breathing much harder and faster and cannot hold a conversation while exercising. You must avoid:  Contact sports.  Activities that put you at risk for falling on or being hit in the belly, such as downhill skiing, water skiing, surfing, rock climbing, cycling, gymnastics, and horseback riding.  Scuba diving.  Skydiving.  Yoga or Pilates in a room that is heated to high temperatures.  Jogging or running, unless you ran or jogged regularly before your pregnancy. While jogging or running, you should always be able to talk in full sentences. Do not run or jog so fast that you are unable to have a conversation.  Do not exercise at more than 6,000 feet above sea level (high elevation) if you are not used to exercising at high elevation. How do I exercise in a safe way?   Avoid overheating. Do not exercise in very high temperatures.  Wear loose-fitting, breathable clothes.  Avoid dehydration. Drink enough water before, during, and after exercise to keep your urine pale yellow.  Avoid overstretching. Because of hormone changes during pregnancy, it is easy to overstretch muscles, tendons, and ligaments during pregnancy.  Start slowly and ask your health care provider to recommend the types of exercise that are safe for you.  Do not exercise to lose weight. Follow these instructions at home:  Exercise on most days or all days of the week. Try to exercise for 30 minutes a day, 5 days a week, unless your health care provider tells you not to.  If  you actively exercised before your pregnancy and you are healthy, your health care provider may tell you to continue to do moderate to high-intensity exercise.  If you are just starting to exercise or did not exercise much before your pregnancy, your health care provider may tell you to do low to moderate-intensity  exercise. Questions to ask your health care provider  Is exercise safe for me?  What are signs that I should stop exercising?  Does my health condition mean that I should not exercise during pregnancy?  When should I avoid exercising during pregnancy? Stop exercising and contact a health care provider if: You have any unusual symptoms, such as:  Mild contractions of the uterus or cramps in the abdomen.  Dizziness that does not go away when you rest. Stop exercising and get help right away if: You have any unusual symptoms, such as:  Sudden, severe pain in your low back or your belly.  Mild contractions of the uterus or cramps in the abdomen that do not improve with rest and drinking fluids.  Chest pain.  Bleeding or fluid leaking from your vagina.  Shortness of breath. These symptoms may represent a serious problem that is an emergency. Do not wait to see if the symptoms will go away. Get medical help right away. Call your local emergency services (911 in the [place] Do not drive yourself to the hospital. Summary  Most women should exercise regularly throughout pregnancy. In rare cases, women with certain medical conditions or complications may be asked to limit or avoid exercise during pregnancy.  Do not exercise to lose weight during pregnancy.  Your health care provider will tell you what level of physical activity is right for you.  Stop exercising and contact a health care provider if you have mild contractions of the uterus or cramps in the abdomen. Get help right away if these contractions or cramps do not improve with rest and drinking fluids.  Stop exercising and get help right away if you have sudden, severe pain in your low back or belly, chest pain, shortness of breath, or bleeding or leaking of fluid from your vagina. This information is not intended to replace advice given to you by your health care provider. Make sure you discuss any questions you have with your  health care provider. Document Revised: 03/17/2019 Document Reviewed: 12/29/2018 Elsevier Patient Education  [place] Weight Gain During Pregnancy, Adult A certain amount of weight gain during pregnancy is normal and healthy. How much weight you should gain depends on your overall health and a measurement called BMI (body mass index). BMI is an estimate of your body fat based on your height and weight. You can use an Freight [employer] to figure out your BMI, or you can ask your health care provider to calculate it for you at your next visit. Your recommended pregnancy weight gain is based on your pre-pregnancy BMI. General guidelines for a healthy total weight gain during pregnancy are listed below. If your BMI at or before the start of your pregnancy is:  Less than 18.5 (underweight), you should gain 28-40 lb (13-18 kg).  18.5-24.9 (normal weight), you should gain 25-35 lb (11-16 kg).  25-29.9 (overweight), you should gain 15-25 lb (7-11 kg).  30 or higher (obese), you should gain 11-20 lb (5-9 kg). These ranges vary depending on your individual health. If you are carrying more than one baby (multiples), it may be safe to gain more weight than these recommendations.  If you gain less weight than recommended, that may be safe as long as your baby is growing and developing normally. How can unhealthy weight gain affect me and my baby? Gaining too much weight during pregnancy can lead to pregnancy complications, such as:  A temporary form of diabetes that develops during pregnancy (gestational diabetes).  High blood pressure during pregnancy and protein in your urine (preeclampsia).  High blood pressure during pregnancy without protein in your urine (gestational hypertension).  Your baby having a high weight at birth, which may: ? Raise your risk of having a more difficult delivery or a surgical delivery (cesarean delivery, or C-section). ? Raise your child's risk of  developing obesity during childhood. Not gaining enough weight can be life-threatening for your baby, and it may raise your baby's chances of:  Being born early (preterm).  Growing more slowly than normal during pregnancy (growth restriction).  Having a low weight at birth. What actions can I take to gain a healthy amount of weight during pregnancy? General instructions  Keep track of your weight gain during pregnancy.  Take over-the-counter and prescription medicines only as told by your health care provider. Take all prenatal supplements as directed.  Keep all health care visits during pregnancy (prenatal visits). These visits are a good time to discuss your weight gain. Your health care provider will weigh you at each visit to make sure you are gaining a healthy amount of weight. Nutrition   Eat a balanced, nutrient-rich diet. Eat plenty of: ? Fruits and vegetables, such as berries and broccoli. ? Whole grains, such as millet, barley, whole-wheat breads and cereals, and oatmeal. ? Low-fat dairy products or non-dairy products such as almond milk or rice milk. ? Protein foods, such as lean meat, chicken, eggs, and legumes (such as peas, beans, soybeans, and lentils).  Avoid foods that are fried or have a lot of fat, salt (sodium), or sugar.  Drink enough fluid to keep your urine pale yellow.  Choose healthy snack and drink options when you are at work or on the go: ? Drink water. Avoid soda, sports drinks, and juices that have added sugar. ? Avoid drinks with caffeine, such as coffee and energy drinks. ? Eat snacks that are high in protein, such as nuts, protein bars, and low-fat yogurt. ? Carry convenient snacks in your purse that do not need refrigeration, such as a pack of trail mix, an apple, or a granola bar.  If you need help improving your diet, work with a health care provider or a diet and nutrition specialist (dietitian). Activity   Exercise regularly, as told by  your health care provider. ? If you were active before becoming pregnant, you may be able to continue your regular fitness activities. ? If you were not active before pregnancy, you may gradually build up to exercising for 30 or more minutes on most days of the week. This may include walking, swimming, or yoga.  Ask your health care provider what activities are safe for you. Talk with your health care provider about whether you may need to be excused from certain school or work activities. Where to find more information Learn more about managing your weight gain during pregnancy from:  American Pregnancy Association: www.americanpregnancy.org  [place] Department of Agriculture pregnancy weight gain calculator: [employer] Summary  Too much weight gain during pregnancy can lead to complications for you and your baby.  Find out your pre-pregnancy BMI to determine how much weight gain is healthy for you.  Eat nutritious foods and stay active.  Keep all of your prenatal visits as told by your health care provider. This information is not intended to replace advice given to you by your health care provider. Make sure you discuss any questions you have with your health care provider. Document Revised: 08/17/2019 Document Reviewed: 08/14/2017 Elsevier Patient Education  [place].   Common Medications Safe in Pregnancy  Acne:      Constipation:  Benzoyl Peroxide     Colace  Clindamycin      Dulcolax Suppository  Topica Erythromycin     Fibercon  Salicylic Acid      Metamucil         Miralax AVOID:        Senakot   Accutane    Cough:  Retin-A       Cough Drops  Tetracycline      Phenergan w/ Codeine if Rx  Minocycline      Robitussin (Plain & DM)  Antibiotics:     Crabs/Lice:  Ceclor       RID  Cephalosporins    AVOID:  E-Mycins      Kwell  Keflex  Macrobid/Macrodantin   Diarrhea:  Penicillin      Kao-Pectate  Zithromax      Imodium AD         PUSH  FLUIDS AVOID:       Cipro     Fever:  Tetracycline      Tylenol (Regular or Extra  Minocycline       Strength)  Levaquin      Extra Strength-Do not          Exceed 8 tabs/24 hrs Caffeine:        <252m/day (equiv. To 1 cup of coffee or  approx. 3 12 oz sodas)         Gas: Cold/Hayfever:       Gas-X  Benadryl      Mylicon  Claritin       Phazyme  **Claritin-D        Chlor-Trimeton    Headaches:  Dimetapp      ASA-Free Excedrin  Drixoral-Non-Drowsy     Cold Compress  Mucinex (Guaifenasin)     Tylenol (Regular or Extra  Sudafed/Sudafed-12 Hour     Strength)  **Sudafed PE Pseudoephedrine   Tylenol Cold & Sinus     Vicks Vapor Rub  Zyrtec  **AVOID if Problems With Blood Pressure         Heartburn: Avoid lying down for at least 1 hour after meals  Aciphex      Maalox     Rash:  Milk of Magnesia     Benadryl    Mylanta       1% Hydrocortisone Cream  Pepcid  Pepcid Complete   Sleep Aids:  Prevacid      Ambien   Prilosec       Benadryl  Rolaids       Chamomile Tea  Tums (Limit 4/day)     Unisom         Tylenol PM         Warm milk-add vanilla or  Hemorrhoids:       Sugar for taste  Anusol/Anusol H.C.  (RX: Analapram 2.5%)  Sugar Substitutes:  Hydrocortisone OTC     Ok in moderation  Preparation H      Tucks        Vaseline lotion applied to tissue with wiping  Herpes:     Throat:  Acyclovir      Oragel  Famvir  Valtrex     Vaccines:         Flu Shot Leg Cramps:       *Gardasil  Benadryl      Hepatitis A         Hepatitis B Nasal Spray:       Pneumovax  Saline Nasal Spray     Polio Booster         Tetanus Nausea:       Tuberculosis test or PPD  Vitamin B6 25 mg TID   AVOID:    Dramamine      *Gardasil  Emetrol       Live Poliovirus  Ginger Root 250 mg QID    MMR (measles, mumps &  High Complex Carbs @ Bedtime    rebella)  Sea Bands-Accupressure    Varicella (Chickenpox)  Unisom 1/2 tab TID     *No known complications           If received  before Pain:         Known pregnancy;   Darvocet       Resume series after  Lortab        Delivery  Percocet    Yeast:   Tramadol      Femstat  Tylenol 3      Gyne-lotrimin  Ultram       Monistat  Vicodin           MISC:         All Sunscreens           Hair Coloring/highlights          Insect Repellant's          (Including DEET)         Mystic Tans    Second Trimester of Pregnancy  The second trimester is from week 14 through week 27 (month 4 through 6). This is often the time in pregnancy that you feel your best. Often times, morning sickness has lessened or quit. You may have more energy, and you may get hungry more often. Your unborn baby is growing rapidly. At the end of the sixth month, he or she is about 9 inches long and weighs about 1 pounds. You will likely feel the baby move between 18 and 20 weeks of pregnancy. Follow these instructions at home: Medicines  Take over-the-counter and prescription medicines only as told by your doctor. Some medicines are safe and some medicines are not safe during pregnancy.  Take a prenatal vitamin that contains at least 600 micrograms (mcg) of folic acid.  If you have trouble pooping (constipation), take medicine that will make your stool soft (stool softener) if your doctor approves. Eating and drinking   Eat regular, healthy meals.  Avoid raw meat and uncooked cheese.  If you get low calcium from the food you eat, talk to your doctor about taking a daily calcium supplement.  Avoid foods that are high in fat and sugars, such as fried and sweet foods.  If you feel sick to your stomach (nauseous) or throw up (vomit): ? Eat 4 or 5 small meals a day instead of 3 large meals. ? Try eating a few soda crackers. ? Drink liquids between meals instead of during meals.  To prevent constipation: ? Eat foods that are high in fiber, like fresh fruits and vegetables, whole grains, and beans. ? Drink enough fluids   keep your pee (urine)  clear or pale yellow. Activity  Exercise only as told by your doctor. Stop exercising if you start to have cramps.  Do not exercise if it is too hot, too humid, or if you are in a place of great height (high altitude).  Avoid heavy lifting.  Wear low-heeled shoes. Sit and stand up straight.  You can continue to have sex unless your doctor tells you not to. Relieving pain and discomfort  Wear a good support bra if your breasts are tender.  Take warm water baths (sitz baths) to soothe pain or discomfort caused by hemorrhoids. Use hemorrhoid cream if your doctor approves.  Rest with your legs raised if you have leg cramps or low back pain.  If you develop puffy, bulging veins (varicose veins) in your legs: ? Wear support hose or compression stockings as told by your doctor. ? Raise (elevate) your feet for 15 minutes, 3-4 times a day. ? Limit salt in your food. Prenatal care  Write down your questions. Take them to your prenatal visits.  Keep all your prenatal visits as told by your doctor. This is important. Safety  Wear your seat belt when driving.  Make a list of emergency phone numbers, including numbers for family, friends, the hospital, and police and fire departments. General instructions  Ask your doctor about the right foods to eat or for help finding a counselor, if you need these services.  Ask your doctor about local prenatal classes. Begin classes before month 6 of your pregnancy.  Do not use hot tubs, steam rooms, or saunas.  Do not douche or use tampons or scented sanitary pads.  Do not cross your legs for long periods of time.  Visit your dentist if you have not done so. Use a soft toothbrush to brush your teeth. Floss gently.  Avoid all smoking, herbs, and alcohol. Avoid drugs that are not approved by your doctor.  Do not use any products that contain nicotine or tobacco, such as cigarettes and e-cigarettes. If you need help quitting, ask your  doctor.  Avoid cat litter boxes and soil used by cats. These carry germs that can cause birth defects in the baby and can cause a loss of your baby (miscarriage) or stillbirth. Contact a doctor if:  You have mild cramps or pressure in your lower belly.  You have pain when you pee (urinate).  You have bad smelling fluid coming from your vagina.  You continue to feel sick to your stomach (nauseous), throw up (vomit), or have watery poop (diarrhea).  You have a nagging pain in your belly area.  You feel dizzy. Get help right away if:  You have a fever.  You are leaking fluid from your vagina.  You have spotting or bleeding from your vagina.  You have severe belly cramping or pain.  You lose or gain weight rapidly.  You have trouble catching your breath and have chest pain.  You notice sudden or extreme puffiness (swelling) of your face, hands, ankles, feet, or legs.  You have not felt the baby move in over an hour.  You have severe headaches that do not go away when you take medicine.  You have trouble seeing. Summary  The second trimester is from week 14 through week 27 (months 4 through 6). This is often the time in pregnancy that you feel your best.  To take care of yourself and your unborn baby, you will need to eat healthy meals, take  medicines only if your doctor tells you to do so, and do activities that are safe for you and your baby.  Call your doctor if you get sick or if you notice anything unusual about your pregnancy. Also, call your doctor if you need help with the right food to eat, or if you want to know what activities are safe for you. This information is not intended to replace advice given to you by your health care provider. Make sure you discuss any questions you have with your health care provider. Document Revised: 03/18/2019 Document Reviewed: 12/30/2016 Elsevier Patient Education  [place].   Pregnancy and Travel  Most pregnant women  can safely travel until the last month of their pregnancy. Your doctor may recommend limiting or avoiding travel depending on how far you are in the pregnancy, and if you have any medical or pregnancy problems. General travel tips Before you go:  Discuss your trip with your doctor. Get examined shortly before you go.  Get a copy of your medical records. Take it with you.  Try to get names of doctors and hospitals in the area where you will be visiting.  Pack your pillow.  Pack any approved medicines and supplements.  Get enough sleep the night before the trip. During your trip:  Ask for locations of doctors and hospitals.  Wear flat, comfortable shoes.  Wear loose-fitting, comfortable clothes.  Wear compression stockings as told by your doctor. They may prevent blood clots that arise from sitting for a long time.  Do leg exercises as told by your doctor.  Eat a balanced diet, drink lots of fluid, and take your vitamins and supplements.  Take water, crackers, and fruit with you.  Take breaks to use the restroom and walk every 2 hours or during stops.  Do not wear yourself out.  Do not ride on a motorcycle.  Rest. If your trip is long, lie down for 30 or more minutes with your feet slightly raised after you reach your destination.  Always wear a seat belt. Tips for traveling to a foreign country Before you go:  Ask your doctor if there are medicines that are safe for you to take if you get diarrhea, constipation, nausea, or vomiting.  Check with your health insurance provider about medical coverage abroad. Purchase travel [employer], if needed.  Make sure you are up to date on vaccines. During your trip:  Do not eat uncooked foods.  Do not eat food from buffets or food that is cold or sitting at room temperature.  Drink bottled beverages and water. Do not use ice.  Wash fruits and vegetables with clean water. If possible, peel them before eating.  Do not  drink unpasteurized milk.  Wear insect repellent if there are mosquitoes or other biting insects. Ask your doctor which repellents are safe. What do I need to know about traveling by car?  Wear your seat belt properly. The belt should be buckled below your abdomen, on your hip bones. The shoulder belt should be off to the side of your abdomen and across the center of your chest.  If you are in the front seat, sit as far away from the dashboard as possible to avoid getting hit hard if the airbag deploys in an accident.  Do not travel for more than 5-6 hours a day. What do I need to know about traveling by bus?  Before making a reservation, ask whether your bus will have a restroom.  Move your arms and legs when seated.  If you have to use the restroom, hold on to the seats and handrails as you walk.  Do not travel for more than 5-6 hours a day. What do I need to know about traveling by train?  Before making a reservation, ask if your train will have a sleeping car and more than one restroom.  If you need to walk while the train is moving, hold on to seats and handrails.  Move your arms and legs when seated.  Do not travel for more than 5-6 hours a day. What do I need to know about traveling by airplane?  Before booking your trip, ask about the airline's rules about pregnancy. Pregnant women may be restricted from [place] after a certain time of the pregnancy. Every airline has its own rules.  Make sure you complete your trip before 36 weeks of pregnancy.  Ask whether the airplane cabin will be pressurized. Do not board an unpressurized plane that will fly above 7,000 ft (2,100 m).  Try to get a bulkhead or an aisle seat so it is easier to get up, stretch, and use the bathroom.  Wear layers since the cabin temperature can change.  Put all your medicines and medical records in your carry-on bag.  Avoid drinking caffeinated or carbonated beverages.  Avoid eating foods that may  make you bloated.  Do not eat a big meal.  If you need to walk through the airplane, hold on to the seats and handrails.  Move your arms and legs when seated.  Wear your seat belt. What do I need to know about traveling by cruise ship?  Before booking your trip, ask the [place]: ? Are pregnant women allowed on the ship? ? Is there a medical facility and doctor on board? ? Does the ship dock in places where there are doctors and medical facilities?  Before booking your trip, ask your doctor: ? Is it safe to take medicines if I get seasick? ? Is it safe to wear acupressure wristbands to prevent seasickness? If the answer is yes, consider buying one. Contact a health care provider if:  You have diarrhea.  You vomit.  You have nausea or seasickness. Get help right away if:  You have vaginal bleeding.  You have severe vomiting or diarrhea.  You have pelvic or abdominal pain.  You have contractions.  Your water breaks.  You have a persistent headache.  Your eyesight changes or you see spots.  Your face or hands are swollen.  You have pain, warmth, or swelling in your legs or ankles. Summary  Most pregnant women can safely travel until the last month of their pregnancy. Your doctor may tell you to limit or avoid travel depending on how far you are in your pregnancy and if you have any medical or pregnancy problems.  The best time to travel is between 14 and 28 weeks of your pregnancy.  Before you go on your trip, make sure you discuss your trip with your health care provider, get a copy of your medical records, and try to get information on medical centers and doctors at your destination.  While on your trip, make sure you wear comfortable clothes and shoes, eat a healthy diet, drink plenty of fluids, take your vitamins and supplements, take breaks and rest often, and wear your seat belt.  Before booking a flight, ask about the airline's rules about  pregnancy. Every airline has its own rules.  Do the same for a train, bus, or cruise ship. This information is not intended to replace advice given to you by your health care provider. Make sure you discuss any questions you have with your health care provider. Document Revised: 05/10/2019 Document Reviewed: 12/30/2016 Elsevier Patient Education  [place].

## 2020-07-12 NOTE — Progress Notes (Signed)
TRANSFER IN OB HISTORY AND PHYSICAL  SUBJECTIVE:       Elizabeth Harding is a 34 y.o. [phone] female, Patient's last menstrual period was 02/23/2020., Estimated Date of Delivery: 11/29/20, [redacted]w[redacted]d, presents today for Transition of Prenatal Care. EPIC data migration from outside records is accomplished today.  Complaints today include low weight gain in pregnancy.   Denies difficulty breathing or respiratory distress, chest pain, abdominal pain, vaginal bleeding, dysuria, and leg pain or swelling.    Gynecologic History  Patient's last menstrual period was 02/23/2020.   Contraception: none  Last Pap: 02/2019. Results were: Neg/Neg  Obstetric History  OB History  Gravida Para Term Preterm AB Living  4 2 2   1 2   SAB TAB Ectopic Multiple Live Births  1     0 2    # Outcome Date GA Lbr Len/2nd Weight Sex Delivery Anes PTL Lv  4 Current           3 Term 08/19/17 [redacted]w[redacted]d 12:10 / 00:01 6 lb 14.6 oz (3.135 kg) F Vag-Spont Local  LIV  2 Term 02/27/16 [redacted]w[redacted]d 13:05 / 01:44 8 lb 5 oz (3.771 kg) M Vag-Spont Local  LIV  1 SAB             Past Medical History:  Diagnosis Date  . Allergic rhinitis due to pollen    hay fever  . Anxiety   . Asthma   . Chicken pox   . Irregular intermenstrual bleeding   . Jaundice    as a newborn  . Traumatic injury during pregnancy in third trimester 12/24/2015    Past Surgical History:  Procedure Laterality Date  . 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 WISDOM TOOTH EXTRACTION      Current Outpatient Medications on File Prior to Visit  Medication Sig Dispense Refill  . albuterol (VENTOLIN HFA) 108 (90 Base) MCG/ACT inhaler Inhale into the lungs.    [employer] doxylamine, Sleep, (UNISOM) 25 MG tablet Take 25 mg by mouth at bedtime as needed.    . famotidine (PEPCID) 40 MG tablet Take 40 mg by mouth 2 (two) times daily.    . Lactase (LACTAID FAST ACT PO) Take 1 tablet by mouth daily as needed (lactose  intolerance).    . Lactobacillus (PROBIOTIC GOLD EXTRA STRENGTH PO) Take by mouth.    . Prenatal Vit-Fe Fumarate-FA (PRENATAL MULTIVITAMIN) TABS tablet Take 1 tablet by mouth at bedtime.    . pyridOXINE (VITAMIN B-6) 50 MG tablet Take 50 mg by mouth daily.    . ranitidine (ZANTAC) 150 MG tablet Take 1 tablet by mouth 4 (four) times daily.      No current facility-administered medications on file prior to visit.    Allergies  Allergen Reactions  . Sunscreens Rash    Social History   Socioeconomic History  . Marital status: Married    Spouse name: Not on file  . Number of children: Not on file  . Years of education: Not on file  . Highest education level: Not on file  Occupational History  . Not on file  Tobacco Use  . Smoking status: Never Smoker  . Smokeless tobacco: Never Used  Vaping Use  . Vaping Use: Never used  Substance and Sexual Activity  . Alcohol use: Not Currently    Comment: occas  . Drug use: No  . Sexual activity: Yes  Other Topics Concern  . Not on file  Social History Narrative   Lives alone in [place]. Has cat. Teacher.      Exercise - [place], with trainer   Diet - regular   Social Determinants of Health   Financial Resource Strain:   . Difficulty of Paying Living Expenses:   Food Insecurity:   . Worried About Programme researcher, broadcasting/film/video in the Last Year:   . Barista in the Last Year:   Transportation Needs:   . Freight forwarder (Medical):   Marland Kitchen Lack of Transportation (Non-Medical):   Physical Activity:   . Days of Exercise per Week:   . Minutes of Exercise per Session:   Stress:   . Feeling of Stress :   Social Connections:   . Frequency of Communication with Friends and Family:   . Frequency of Social Gatherings with Friends and Family:   . Attends Religious Services:   . Active Member of Clubs or Organizations:   . Attends Banker Meetings:   Marland Kitchen Marital Status:   Intimate Partner Violence:   . Fear of Current or  Ex-Partner:   . Emotionally Abused:   Marland Kitchen Physically Abused:   . Sexually Abused:     Family History  Problem Relation Age of Onset  . Hypertension Other   . Heart disease Paternal Grandmother   . Rheum arthritis Paternal Grandmother   . Rheum arthritis Maternal Grandmother   . Heart disease Paternal Grandfather     The following portions of the patient's history were reviewed and updated as appropriate: allergies, current medications, past OB history, past medical history, past surgical history, past family history, past social history, and problem list.  Review of Systems:  ROS negative except as noted above. Information obtained from patient.   OBJECTIVE:  BP (!) 92/54   Pulse 67   Wt 181 lb (82.1 kg)   LMP 02/23/2020   BMI 28.35 kg/m   Initial Physical Exam (New OB)  GENERAL APPEARANCE: alert, well appearing, in no apparent distress  HEAD: normocephalic, atraumatic  MOUTH: mucous membranes moist, pharynx normal without lesions  THYROID: no thyromegaly or masses present  BREASTS: patient declined exam  LUNGS: clear to auscultation, no wheezes, rales or rhonchi, symmetric air entry  HEART: regular rate and rhythm, no murmurs  ABDOMEN: fundus soft, nontender 20 weeks size  EXTREMITIES: no redness or tenderness in the calves or thighs  SKIN: normal coloration and turgor, no rashes  LYMPH NODES: no adenopathy palpable  NEUROLOGIC: alert, oriented, normal speech, no focal findings or movement disorder noted  PELVIC EXAM: deferred, no complaints  ULTRASOUND REPORT  Location: [facility] Date of Service: 07/12/2020   Indications:Anatomy Ultrasound Findings:  Singleton intrauterine pregnancy is visualized with FHR at 157 BPM. Biometrics give an (U/S) Gestational age of [redacted]w[redacted]d and an (U/S) EDD of 11/26/2020; this correlates with the clinically established Estimated Date of Delivery: 11/29/20  Fetal presentation is Variable.  EFW: 332 g(12 oz). Fetal  Percentile  Placenta: anterior. Grade: 1 AFI: subjectively normal.  Anatomic survey is complete and normal; Gender - surprise.    Right Ovary is normal in appearance. Left Ovary is normal appearance. Survey of the adnexa demonstrates no adnexal masses. There is no free peritoneal fluid in the cul de sac.  Impression: 1. [redacted]w[redacted]d Viable Singleton Intrauterine pregnancy by U/S. 2. (U/S) EDD is consistent with Clinically established Estimated Date of Delivery: 11/29/20 . 3. Normal Anatomy Scan  Recommendations: 1.Clinical correlation with the patient's History and Physical Exam.  ASSESSMENT:  Normal pregnancy O negative Normal, complete anatomy scan Low weight gain in pregnancy Exercised induced asthma-Rx Albuterol  PLAN: Prenatal care New OB counseling: The patient has been given an overview regarding routine prenatal care. Recommendations regarding diet, weight gain, and exercise in pregnancy were given. Prenatal testing, optional genetic testing, and ultrasound use in pregnancy were reviewed.  Benefits of Breast Feeding were discussed. The patient is encouraged to consider nursing her baby post partum. See orders

## 2020-07-12 NOTE — Progress Notes (Signed)
NOB- Pt present for prenatal visit. Would like to discuss water birth.

## 2020-08-09 ENCOUNTER — Other Ambulatory Visit: Payer: Self-pay

## 2020-08-09 ENCOUNTER — Ambulatory Visit (INDEPENDENT_AMBULATORY_CARE_PROVIDER_SITE_OTHER): Payer: BC Managed Care – PPO | Admitting: Certified Nurse Midwife

## 2020-08-09 VITALS — BP 102/54 | HR 61 | Wt 186.2 lb

## 2020-08-09 DIAGNOSIS — Z3A24 24 weeks gestation of pregnancy: Secondary | ICD-10-CM | POA: Diagnosis not present

## 2020-08-09 DIAGNOSIS — Z3482 Encounter for supervision of other normal pregnancy, second trimester: Secondary | ICD-10-CM

## 2020-08-09 LAB — POCT URINALYSIS DIPSTICK OB
Bilirubin, UA: NEGATIVE
Blood, UA: NEGATIVE
Glucose, UA: NEGATIVE
Ketones, UA: NEGATIVE
Leukocytes, UA: NEGATIVE
Nitrite, UA: NEGATIVE
POC,PROTEIN,UA: NEGATIVE
Spec Grav, UA: 1.02 (ref 1.010–1.025)
Urobilinogen, UA: 0.2 E.U./dL
pH, UA: 5 (ref 5.0–8.0)

## 2020-08-09 NOTE — Patient Instructions (Signed)
Common Medications Safe in Pregnancy  Acne:      Constipation:  Benzoyl Peroxide     Colace  Clindamycin      Dulcolax Suppository  Topica Erythromycin     Fibercon  Salicylic Acid      Metamucil         Miralax AVOID:        Senakot   Accutane    Cough:  Retin-A       Cough Drops  Tetracycline      Phenergan w/ Codeine if Rx  Minocycline      Robitussin (Plain & DM)  Antibiotics:     Crabs/Lice:  Ceclor       RID  Cephalosporins    AVOID:  E-Mycins      Kwell  Keflex  Macrobid/Macrodantin   Diarrhea:  Penicillin      Kao-Pectate  Zithromax      Imodium AD         PUSH FLUIDS AVOID:       Cipro     Fever:  Tetracycline      Tylenol (Regular or Extra  Minocycline       Strength)  Levaquin      Extra Strength-Do not          Exceed 8 tabs/24 hrs Caffeine:        <200mg/day (equiv. To 1 cup of coffee or  approx. 3 12 oz sodas)         Gas: Cold/Hayfever:       Gas-X  Benadryl      Mylicon  Claritin       Phazyme  **Claritin-D        Chlor-Trimeton    Headaches:  Dimetapp      ASA-Free Excedrin  Drixoral-Non-Drowsy     Cold Compress  Mucinex (Guaifenasin)     Tylenol (Regular or Extra  Sudafed/Sudafed-12 Hour     Strength)  **Sudafed PE Pseudoephedrine   Tylenol Cold & Sinus     Vicks Vapor Rub  Zyrtec  **AVOID if Problems With Blood Pressure         Heartburn: Avoid lying down for at least 1 hour after meals  Aciphex      Maalox     Rash:  Milk of Magnesia     Benadryl    Mylanta       1% Hydrocortisone Cream  Pepcid  Pepcid Complete   Sleep Aids:  Prevacid      Ambien   Prilosec       Benadryl  Rolaids       Chamomile Tea  Tums (Limit 4/day)     Unisom         Tylenol PM         Warm milk-add vanilla or  Hemorrhoids:       Sugar for taste  Anusol/Anusol H.C.  (RX: Analapram 2.5%)  Sugar Substitutes:  Hydrocortisone OTC     Ok in moderation  Preparation H      Tucks        Vaseline lotion applied to tissue with  wiping    Herpes:     Throat:  Acyclovir      Oragel  Famvir  Valtrex     Vaccines:         Flu Shot Leg Cramps:       *Gardasil  Benadryl      Hepatitis A         Hepatitis B Nasal Spray:         Pneumovax  Saline Nasal Spray     Polio Booster         Tetanus Nausea:       Tuberculosis test or PPD  Vitamin B6 25 mg TID   AVOID:    Dramamine      *Gardasil  Emetrol       Live Poliovirus  Ginger Root 250 mg QID    MMR (measles, mumps &  High Complex Carbs @ Bedtime    rebella)  Sea Bands-Accupressure    Varicella (Chickenpox)  Unisom 1/2 tab TID     *No known complications           If received before Pain:         Known pregnancy;   Darvocet       Resume series after  Lortab        Delivery  Percocet    Yeast:   Tramadol      Femstat  Tylenol 3      Gyne-lotrimin  Ultram       Monistat  Vicodin           MISC:         All Sunscreens           Hair Coloring/highlights          Insect Repellant's          (Including DEET)         Mystic Tans   WHAT OB PATIENTS CAN EXPECT   Confirmation of pregnancy and ultrasound ordered if medically indicated-[redacted] weeks gestation  New OB (NOB) intake with nurse and New OB (NOB) labs- [redacted] weeks gestation  New OB (NOB) physical examination with provider- 11/[redacted] weeks gestation  Flu vaccine-[redacted] weeks gestation  Anatomy scan-[redacted] weeks gestation  Glucose tolerance test, blood work to test for anemia, T-dap vaccine-[redacted] weeks gestation  Vaginal swabs/cultures-STD/Group B strep-[redacted] weeks gestation  Appointments every 4 weeks until 28 weeks  Every 2 weeks from 28 weeks until 36 weeks  Weekly visits from 36 weeks until delivery     Third Trimester of Pregnancy  The third trimester is from week 28 through week 40 (months 7 through 9). This trimester is when your unborn baby (fetus) is growing very fast. At the end of the ninth month, the unborn baby is about 20 inches in length. It weighs about 6-10 pounds. Follow these instructions at  home: Medicines  Take over-the-counter and prescription medicines only as told by your doctor. Some medicines are safe and some medicines are not safe during pregnancy.  Take a prenatal vitamin that contains at least 600 micrograms (mcg) of folic acid.  If you have trouble pooping (constipation), take medicine that will make your stool soft (stool softener) if your doctor approves. Eating and drinking   Eat regular, healthy meals.  Avoid raw meat and uncooked cheese.  If you get low calcium from the food you eat, talk to your doctor about taking a daily calcium supplement.  Eat four or five small meals rather than three large meals a day.  Avoid foods that are high in fat and sugars, such as fried and sweet foods.  To prevent constipation: ? Eat foods that are high in fiber, like fresh fruits and vegetables, whole grains, and beans. ? Drink enough fluids to keep your pee (urine) clear or pale yellow. Activity  Exercise only as told by your doctor. Stop exercising if you start to have cramps.  Avoid heavy lifting, wear low heels,  and sit up straight.  Do not exercise if it is too hot, too humid, or if you are in a place of great height (high altitude).  You may continue to have sex unless your doctor tells you not to. Relieving pain and discomfort  Wear a good support bra if your breasts are tender.  Take frequent breaks and rest with your legs raised if you have leg cramps or low back pain.  Take warm water baths (sitz baths) to soothe pain or discomfort caused by hemorrhoids. Use hemorrhoid cream if your doctor approves.  If you develop puffy, bulging veins (varicose veins) in your legs: ? Wear support hose or compression stockings as told by your doctor. ? Raise (elevate) your feet for 15 minutes, 3-4 times a day. ? Limit salt in your food. Safety  Wear your seat belt when driving.  Make a list of emergency phone numbers, including numbers for family, friends, the  hospital, and police and fire departments. Preparing for your baby's arrival To prepare for the arrival of your baby:  Take prenatal classes.  Practice driving to the hospital.  Visit the hospital and tour the maternity area.  Talk to your work about taking leave once the baby comes.  Pack your hospital bag.  Prepare the baby's room.  Go to your doctor visits.  Buy a rear-facing car seat. Learn how to install it in your car. General instructions  Do not use hot tubs, steam rooms, or saunas.  Do not use any products that contain nicotine or tobacco, such as cigarettes and e-cigarettes. If you need help quitting, ask your doctor.  Do not drink alcohol.  Do not douche or use tampons or scented sanitary pads.  Do not cross your legs for long periods of time.  Do not travel for long distances unless you must. Only do so if your doctor says it is okay.  Visit your dentist if you have not gone during your pregnancy. Use a soft toothbrush to brush your teeth. Be gentle when you floss.  Avoid cat litter boxes and soil used by cats. These carry germs that can cause birth defects in the baby and can cause a loss of your baby (miscarriage) or stillbirth.  Keep all your prenatal visits as told by your doctor. This is important. Contact a doctor if:  You are not sure if you are in labor or if your water has broken.  You are dizzy.  You have mild cramps or pressure in your lower belly.  You have a nagging pain in your belly area.  You continue to feel sick to your stomach, you throw up, or you have watery poop.  You have bad smelling fluid coming from your vagina.  You have pain when you pee. Get help right away if:  You have a fever.  You are leaking fluid from your vagina.  You are spotting or bleeding from your vagina.  You have severe belly cramps or pain.  You lose or gain weight quickly.  You have trouble catching your breath and have chest pain.  You notice  sudden or extreme puffiness (swelling) of your face, hands, ankles, feet, or legs.  You have not felt the baby move in over an hour.  You have severe headaches that do not go away with medicine.  You have trouble seeing.  You are leaking, or you are having a gush of fluid, from your vagina before you are 37 weeks.  You have regular belly spasms (  contractions) before you are 37 weeks. Summary  The third trimester is from week 28 through week 40 (months 7 through 9). This time is when your unborn baby is growing very fast.  Follow your doctor's advice about medicine, food, and activity.  Get ready for the arrival of your baby by taking prenatal classes, getting all the baby items ready, preparing the baby's room, and visiting your doctor to be checked.  Get help right away if you are bleeding from your vagina, or you have chest pain and trouble catching your breath, or if you have not felt your baby move in over an hour. This information is not intended to replace advice given to you by your health care provider. Make sure you discuss any questions you have with your health care provider. Document Revised: 03/17/2019 Document Reviewed: 12/30/2016 Elsevier Patient Education  [place].

## 2020-08-12 NOTE — Progress Notes (Signed)
ROB-Doing well except fatigue with the start of school. Using KT Tape for abdominal support. Drinking dandelion root and nettles tea for varicosities. Encouraged use of compression stockings. Anticipatory guidance regarding course of prenatal care. Reviewed red flag symptoms and when to call. RTC x 3-4 weeks for 28 week labs, TDaP/Rhogam, and ROB or sooner if needed.

## 2020-09-07 ENCOUNTER — Other Ambulatory Visit: Payer: BC Managed Care – PPO

## 2020-09-07 ENCOUNTER — Other Ambulatory Visit: Payer: Self-pay

## 2020-09-07 ENCOUNTER — Ambulatory Visit (INDEPENDENT_AMBULATORY_CARE_PROVIDER_SITE_OTHER): Payer: BC Managed Care – PPO | Admitting: Certified Nurse Midwife

## 2020-09-07 VITALS — BP 102/53 | HR 58 | Wt 189.2 lb

## 2020-09-07 DIAGNOSIS — Z3483 Encounter for supervision of other normal pregnancy, third trimester: Secondary | ICD-10-CM

## 2020-09-07 DIAGNOSIS — Z6741 Type O blood, Rh negative: Secondary | ICD-10-CM | POA: Diagnosis not present

## 2020-09-07 DIAGNOSIS — Z23 Encounter for immunization: Secondary | ICD-10-CM | POA: Diagnosis not present

## 2020-09-07 DIAGNOSIS — Z3A28 28 weeks gestation of pregnancy: Secondary | ICD-10-CM

## 2020-09-07 LAB — POCT URINALYSIS DIPSTICK OB
Bilirubin, UA: NEGATIVE
Blood, UA: NEGATIVE
Glucose, UA: NEGATIVE
Ketones, UA: NEGATIVE
Leukocytes, UA: NEGATIVE
Nitrite, UA: NEGATIVE
POC,PROTEIN,UA: NEGATIVE
Spec Grav, UA: 1.01 (ref 1.010–1.025)
Urobilinogen, UA: 0.2 E.U./dL
pH, UA: 6 (ref 5.0–8.0)

## 2020-09-07 MED ORDER — RHO D IMMUNE GLOBULIN 1500 UNIT/2ML IJ SOSY: 300.0000 ug | PREFILLED_SYRINGE | Freq: Once | INTRAMUSCULAR | Status: DC

## 2020-09-07 MED ORDER — TETANUS-DIPHTH-ACELL PERTUSSIS 5-2.5-18.5 LF-MCG/0.5 IM SUSP
0.5000 mL | Freq: Once | INTRAMUSCULAR | Status: AC
  Administered 2020-09-07: 0.5 mL via INTRAMUSCULAR

## 2020-09-07 NOTE — Progress Notes (Signed)
ROB-Doing well. Working from home due to positive COVID at [facility]; asymptomatic at this time. TDaP, Rhogam, and Flu vaccine given today, see chart. Education regarding COVID booster. Blood transfusion consent and Breastfeeding checklist completed. Third trimester handouts provided. 28 week labs today, will contact patient with results. Anticipatory guidance regarding course of prenatal care. Reviewed red flag symptoms and when to call. RTC x 2-3 weeks for ROB or sooner if needed.

## 2020-09-07 NOTE — Patient Instructions (Addendum)
Healthy Weight Gain During Pregnancy, Adult A certain amount of weight gain during pregnancy is normal and healthy. How much weight you should gain depends on your overall health and a measurement called BMI (body mass index). BMI is an estimate of your body fat based on your height and weight. You can use an online calculator to figure out your BMI, or you can ask your health care provider to calculate it for you at your next visit. Your recommended pregnancy weight gain is based on your pre-pregnancy BMI. General guidelines for a healthy total weight gain during pregnancy are listed below. If your BMI at or before the start of your pregnancy is:  Less than 18.5 (underweight), you should gain 28-40 lb (13-18 kg).  18.5-24.9 (normal weight), you should gain 25-35 lb (11-16 kg).  25-29.9 (overweight), you should gain 15-25 lb (7-11 kg).  30 or higher (obese), you should gain 11-20 lb (5-9 kg). These ranges vary depending on your individual health. If you are carrying more than one baby (multiples), it may be safe to gain more weight than these recommendations. If you gain less weight than recommended, that may be safe as long as your baby is growing and developing normally. How can unhealthy weight gain affect me and my baby? Gaining too much weight during pregnancy can lead to pregnancy complications, such as:  A temporary form of diabetes that develops during pregnancy (gestational diabetes).  High blood pressure during pregnancy and protein in your urine (preeclampsia).  High blood pressure during pregnancy without protein in your urine (gestational hypertension).  Your baby having a high weight at birth, which may: ? Raise your risk of having a more difficult delivery or a surgical delivery (cesarean delivery, or C-section). ? Raise your child's risk of developing obesity during childhood. Not gaining enough weight can be life-threatening for your baby, and it may raise your baby's chances  of:  Being born early (preterm).  Growing more slowly than normal during pregnancy (growth restriction).  Having a low weight at birth. What actions can I take to gain a healthy amount of weight during pregnancy? General instructions  Keep track of your weight gain during pregnancy.  Take over-the-counter and prescription medicines only as told by your health care provider. Take all prenatal supplements as directed.  Keep all health care visits during pregnancy (prenatal visits). These visits are a good time to discuss your weight gain. Your health care provider will weigh you at each visit to make sure you are gaining a healthy amount of weight. Nutrition   Eat a balanced, nutrient-rich diet. Eat plenty of: ? Fruits and vegetables, such as berries and broccoli. ? Whole grains, such as millet, barley, whole-wheat breads and cereals, and oatmeal. ? Low-fat dairy products or non-dairy products such as almond milk or rice milk. ? Protein foods, such as lean meat, chicken, eggs, and legumes (such as peas, beans, soybeans, and lentils).  Avoid foods that are fried or have a lot of fat, salt (sodium), or sugar.  Drink enough fluid to keep your urine pale yellow.  Choose healthy snack and drink options when you are at work or on the go: ? Drink water. Avoid soda, sports drinks, and juices that have added sugar. ? Avoid drinks with caffeine, such as coffee and energy drinks. ? Eat snacks that are high in protein, such as nuts, protein bars, and low-fat yogurt. ? Carry convenient snacks in your purse that do not need refrigeration, such as a pack of   trail mix, an apple, or a granola bar.  If you need help improving your diet, work with a health care provider or a diet and nutrition specialist (dietitian). Activity   Exercise regularly, as told by your health care provider. ? If you were active before becoming pregnant, you may be able to continue your regular fitness activities. ? If  you were not active before pregnancy, you may gradually build up to exercising for 30 or more minutes on most days of the week. This may include walking, swimming, or yoga.  Ask your health care provider what activities are safe for you. Talk with your health care provider about whether you may need to be excused from certain school or work activities. Where to find more information Learn more about managing your weight gain during pregnancy from:  American Pregnancy Association: www.americanpregnancy.org  [place] Department of Agriculture pregnancy weight gain calculator: [employer] Summary  Too much weight gain during pregnancy can lead to complications for you and your baby.  Find out your pre-pregnancy BMI to determine how much weight gain is healthy for you.  Eat nutritious foods and stay active.  Keep all of your prenatal visits as told by your health care provider. This information is not intended to replace advice given to you by your health care provider. Make sure you discuss any questions you have with your health care provider. Document Revised: 08/17/2019 Document Reviewed: 08/14/2017 Elsevier Patient Education  [place].   Exercise During Pregnancy Exercise is an important part of being healthy for people of all ages. Exercise improves the function of your heart and lungs and helps you maintain strength, flexibility, and a healthy body weight. Exercise also boosts energy levels and elevates mood. Most women should exercise regularly during pregnancy. In rare cases, women with certain medical conditions or complications may be asked to limit or avoid exercise during pregnancy. How does this affect me? Along with maintaining general strength and flexibility, exercising during pregnancy can help:  Keep strength in muscles that are used during labor and childbirth.  Decrease low back pain.  Reduce symptoms of depression.  Control weight gain during  pregnancy.  Reduce the risk of needing insulin if you develop diabetes during pregnancy.  Decrease the risk of cesarean delivery.  Speed up your recovery after giving birth. How does this affect my baby? Exercise can help you have a healthy pregnancy. Exercise does not cause premature birth. It will not cause your baby to weigh less at birth. What exercises can I do? Many exercises are safe for you to do during pregnancy. Do a variety of exercises that safely increase your heart and breathing rates and help you build and maintain muscle strength. Do exercises exactly as told by your health care provider. You may do these exercises:  Walking or hiking.  Swimming.  Water aerobics.  Riding a stationary bike.  Strength training.  Modified yoga or Pilates. Tell your instructor that you are pregnant. Avoid overstretching, and avoid lying on your back for long periods of time.  Running or jogging. Only choose this type of exercise if you: ? Ran or jogged regularly before your pregnancy. ? Can run or jog and still talk in complete sentences. What exercises should I avoid? Depending on your level of fitness and whether you exercised regularly before your pregnancy, you may be told to limit high-intensity exercise. You can tell that you are exercising at a high intensity if you are breathing much harder and faster and  cannot hold a conversation while exercising. You must avoid:  Contact sports.  Activities that put you at risk for falling on or being hit in the belly, such as downhill skiing, water skiing, surfing, rock climbing, cycling, gymnastics, and horseback riding.  Scuba diving.  Skydiving.  Yoga or Pilates in a room that is heated to high temperatures.  Jogging or running, unless you ran or jogged regularly before your pregnancy. While jogging or running, you should always be able to talk in full sentences. Do not run or jog so fast that you are unable to have a  conversation.  Do not exercise at more than 6,000 feet above sea level (high elevation) if you are not used to exercising at high elevation. How do I exercise in a safe way?   Avoid overheating. Do not exercise in very high temperatures.  Wear loose-fitting, breathable clothes.  Avoid dehydration. Drink enough water before, during, and after exercise to keep your urine pale yellow.  Avoid overstretching. Because of hormone changes during pregnancy, it is easy to overstretch muscles, tendons, and ligaments during pregnancy.  Start slowly and ask your health care provider to recommend the types of exercise that are safe for you.  Do not exercise to lose weight. Follow these instructions at home:  Exercise on most days or all days of the week. Try to exercise for 30 minutes a day, 5 days a week, unless your health care provider tells you not to.  If you actively exercised before your pregnancy and you are healthy, your health care provider may tell you to continue to do moderate to high-intensity exercise.  If you are just starting to exercise or did not exercise much before your pregnancy, your health care provider may tell you to do low to moderate-intensity exercise. Questions to ask your health care provider  Is exercise safe for me?  What are signs that I should stop exercising?  Does my health condition mean that I should not exercise during pregnancy?  When should I avoid exercising during pregnancy? Stop exercising and contact a health care provider if: You have any unusual symptoms, such as:  Mild contractions of the uterus or cramps in the abdomen.  Dizziness that does not go away when you rest. Stop exercising and get help right away if: You have any unusual symptoms, such as:  Sudden, severe pain in your low back or your belly.  Mild contractions of the uterus or cramps in the abdomen that do not improve with rest and drinking fluids.  Chest pain.  Bleeding or  fluid leaking from your vagina.  Shortness of breath. These symptoms may represent a serious problem that is an emergency. Do not wait to see if the symptoms will go away. Get medical help right away. Call your local emergency services (911 in the [place] Do not drive yourself to the hospital. Summary  Most women should exercise regularly throughout pregnancy. In rare cases, women with certain medical conditions or complications may be asked to limit or avoid exercise during pregnancy.  Do not exercise to lose weight during pregnancy.  Your health care provider will tell you what level of physical activity is right for you.  Stop exercising and contact a health care provider if you have mild contractions of the uterus or cramps in the abdomen. Get help right away if these contractions or cramps do not improve with rest and drinking fluids.  Stop exercising and get help right away if you have sudden, severe  pain in your low back or belly, chest pain, shortness of breath, or bleeding or leaking of fluid from your vagina. This information is not intended to replace advice given to you by your health care provider. Make sure you discuss any questions you have with your health care provider. Document Revised: 03/17/2019 Document Reviewed: 12/29/2018 Elsevier Patient Education  2020 [employer] Inc[employer]   Common Medications Safe in Pregnancy  Acne:      Constipation:  Benzoyl Peroxide     Colace  Clindamycin      Dulcolax Suppository  Topica Erythromycin     Fibercon  Salicylic Acid      Metamucil         Miralax AVOID:        Senakot   Accutane    Cough:  Retin-A       Cough Drops  Tetracycline      Phenergan w/ Codeine if Rx  Minocycline      Robitussin (Plain & DM)  Antibiotics:     Crabs/Lice:  Ceclor       RID  Cephalosporins    AVOID:  E-Mycins      Kwell  Keflex  Macrobid/Macrodantin   Diarrhea:  Penicillin      Kao-Pectate  Zithromax      Imodium AD         PUSH  FLUIDS AVOID:       Cipro     Fever:  Tetracycline      Tylenol (Regular or Extra  Minocycline       Strength)  Levaquin      Extra Strength-Do not          Exceed 8 tabs/24 hrs Caffeine:        <200mg/day (equiv. To 1 cup of coffee or  approx. 3 12 oz sodas)         Gas: Cold/Hayfever:       Gas-X  Benadryl      Mylicon  Claritin       Phazyme  **Claritin-D        Chlor-Trimeton    Headaches:  Dimetapp      ASA-Free Excedrin  Drixoral-Non-Drowsy     Cold Compress  Mucinex (Guaifenasin)     Tylenol (Regular or Extra  Sudafed/Sudafed-12 Hour     Strength)  **Sudafed PE Pseudoephedrine   Tylenol Cold & Sinus     Vicks Vapor Rub  Zyrtec  **AVOID if Problems With Blood Pressure         Heartburn: Avoid lying down for at least 1 hour after meals  Aciphex      Maalox     Rash:  Milk of Magnesia     Benadryl    Mylanta       1% Hydrocortisone Cream  Pepcid  Pepcid Complete   Sleep Aids:  Prevacid      Ambien   Prilosec       Benadryl  Rolaids       Chamomile Tea  Tums (Limit 4/day)     Unisom         Tylenol PM         Warm milk-add vanilla or  Hemorrhoids:       Sugar for taste  Anusol/Anusol H.C.  (RX: Analapram 2.5%)  Sugar Substitutes:  Hydrocortisone OTC     Ok in moderation  Preparation H      Tucks        Vaseline lotion applied to tissue with wiping      Herpes:     Throat:  Acyclovir      Oragel  Famvir  Valtrex     Vaccines:         Flu Shot Leg Cramps:       *Gardasil  Benadryl      Hepatitis A         Hepatitis B Nasal Spray:       Pneumovax  Saline Nasal Spray     Polio Booster         Tetanus Nausea:       Tuberculosis test or PPD  Vitamin B6 25 mg TID   AVOID:    Dramamine      *Gardasil  Emetrol       Live Poliovirus  Ginger Root 250 mg QID    MMR (measles, mumps &  High Complex Carbs @ Bedtime    rebella)  Sea Bands-Accupressure    Varicella (Chickenpox)  Unisom 1/2 tab TID     *No known complications           If received  before Pain:         Known pregnancy;   Darvocet       Resume series after  Lortab        Delivery  Percocet    Yeast:   Tramadol      Femstat  Tylenol 3      Gyne-lotrimin  Ultram       Monistat  Vicodin           MISC:         All Sunscreens           Hair Coloring/highlights          Insect Repellant's          (Including DEET)         Mystic Tans   Fetal Movement Counts Patient Name: ________________________________________________ Patient Due Date: ____________________ What is a fetal movement count?  A fetal movement count is the number of times that you feel your baby move during a certain amount of time. This may also be called a fetal kick count. A fetal movement count is recommended for every pregnant woman. You may be asked to start counting fetal movements as early as week 28 of your pregnancy. Pay attention to when your baby is most active. You may notice your baby's sleep and wake cycles. You may also notice things that make your baby move more. You should do a fetal movement count:  When your baby is normally most active.  At the same time each day. A good time to count movements is while you are resting, after having something to eat and drink. How do I count fetal movements? 1. Find a quiet, comfortable area. Sit, or lie down on your side. 2. Write down the date, the start time and stop time, and the number of movements that you felt between those two times. Take this information with you to your health care visits. 3. Write down your start time when you feel the first movement. 4. Count kicks, flutters, swishes, rolls, and jabs. You should feel at least 10 movements. 5. You may stop counting after you have felt 10 movements, or if you have been counting for 2 hours. Write down the stop time. 6. If you do not feel 10 movements in 2 hours, contact your health care provider for further instructions. Your health care provider may want to do additional tests to assess  your baby's well-being. Contact a health care provider if:  You feel fewer than 10 movements in 2 hours.  Your baby is not moving like he or she usually does. Date: ____________ Start time: ____________ Stop time: ____________ Movements: ____________ Date: ____________ Start time: ____________ Stop time: ____________ Movements: ____________ Date: ____________ Start time: ____________ Stop time: ____________ Movements: ____________ Date: ____________ Start time: ____________ Stop time: ____________ Movements: ____________ Date: ____________ Start time: ____________ Stop time: ____________ Movements: ____________ Date: ____________ Start time: ____________ Stop time: ____________ Movements: ____________ Date: ____________ Start time: ____________ Stop time: ____________ Movements: ____________ Date: ____________ Start time: ____________ Stop time: ____________ Movements: ____________ Date: ____________ Start time: ____________ Stop time: ____________ Movements: ____________ This information is not intended to replace advice given to you by your health care provider. Make sure you discuss any questions you have with your health care provider. Document Revised: 07/14/2019 Document Reviewed: 07/14/2019 Elsevier Patient Education  [place] of Pregnancy  The third trimester is from week 28 through week 40 (months 7 through 9). This trimester is when your unborn baby (fetus) is growing very fast. At the end of the ninth month, the unborn baby is about 20 inches in length. It weighs about 6-10 pounds. Follow these instructions at home: Medicines  Take over-the-counter and prescription medicines only as told by your doctor. Some medicines are safe and some medicines are not safe during pregnancy.  Take a prenatal vitamin that contains at least 600 micrograms (mcg) of folic acid.  If you have trouble pooping (constipation), take medicine that will make your stool soft  (stool softener) if your doctor approves. Eating and drinking   Eat regular, healthy meals.  Avoid raw meat and uncooked cheese.  If you get low calcium from the food you eat, talk to your doctor about taking a daily calcium supplement.  Eat four or five small meals rather than three large meals a day.  Avoid foods that are high in fat and sugars, such as fried and sweet foods.  To prevent constipation: ? Eat foods that are high in fiber, like fresh fruits and vegetables, whole grains, and beans. ? Drink enough fluids to keep your pee (urine) clear or pale yellow. Activity  Exercise only as told by your doctor. Stop exercising if you start to have cramps.  Avoid heavy lifting, wear low heels, and sit up straight.  Do not exercise if it is too hot, too humid, or if you are in a place of great height (high altitude).  You may continue to have sex unless your doctor tells you not to. Relieving pain and discomfort  Wear a good support bra if your breasts are tender.  Take frequent breaks and rest with your legs raised if you have leg cramps or low back pain.  Take warm water baths (sitz baths) to soothe pain or discomfort caused by hemorrhoids. Use hemorrhoid cream if your doctor approves.  If you develop puffy, bulging veins (varicose veins) in your legs: ? Wear support hose or compression stockings as told by your doctor. ? Raise (elevate) your feet for 15 minutes, 3-4 times a day. ? Limit salt in your food. Safety  Wear your seat belt when driving.  Make a list of emergency phone numbers, including numbers for family, friends, the hospital, and police and fire departments. Preparing for your baby's arrival To prepare for the arrival of your baby:  Take prenatal classes.  Practice driving to the hospital.  Visit the hospital and tour the maternity area.  Talk to your work about taking leave once the baby comes.  Pack your hospital bag.  Prepare the baby's  room.  Go to your doctor visits.  Buy a rear-facing car seat. Learn how to install it in your car. General instructions  Do not use hot tubs, steam rooms, or saunas.  Do not use any products that contain nicotine or tobacco, such as cigarettes and e-cigarettes. If you need help quitting, ask your doctor.  Do not drink alcohol.  Do not douche or use tampons or scented sanitary pads.  Do not cross your legs for long periods of time.  Do not travel for long distances unless you must. Only do so if your doctor says it is okay.  Visit your dentist if you have not gone during your pregnancy. Use a soft toothbrush to brush your teeth. Be gentle when you floss.  Avoid cat litter boxes and soil used by cats. These carry germs that can cause birth defects in the baby and can cause a loss of your baby (miscarriage) or stillbirth.  Keep all your prenatal visits as told by your doctor. This is important. Contact a doctor if:  You are not sure if you are in labor or if your water has broken.  You are dizzy.  You have mild cramps or pressure in your lower belly.  You have a nagging pain in your belly area.  You continue to feel sick to your stomach, you throw up, or you have watery poop.  You have bad smelling fluid coming from your vagina.  You have pain when you pee. Get help right away if:  You have a fever.  You are leaking fluid from your vagina.  You are spotting or bleeding from your vagina.  You have severe belly cramps or pain.  You lose or gain weight quickly.  You have trouble catching your breath and have chest pain.  You notice sudden or extreme puffiness (swelling) of your face, hands, ankles, feet, or legs.  You have not felt the baby move in over an hour.  You have severe headaches that do not go away with medicine.  You have trouble seeing.  You are leaking, or you are having a gush of fluid, from your vagina before you are 37 weeks.  You have regular  belly spasms (contractions) before you are 37 weeks. Summary  The third trimester is from week 28 through week 40 (months 7 through 9). This time is when your unborn baby is growing very fast.  Follow your doctor's advice about medicine, food, and activity.  Get ready for the arrival of your baby by taking prenatal classes, getting all the baby items ready, preparing the baby's room, and visiting your doctor to be checked.  Get help right away if you are bleeding from your vagina, or you have chest pain and trouble catching your breath, or if you have not felt your baby move in over an hour. This information is not intended to replace advice given to you by your health care provider. Make sure you discuss any questions you have with your health care provider. Document Revised: 03/17/2019 Document Reviewed: 12/30/2016 Elsevier Patient Education  [place].   WHAT OB PATIENTS CAN EXPECT   Confirmation of pregnancy and ultrasound ordered if medically indicated-[redacted] weeks gestation  New OB (NOB) intake with nurse and New OB (NOB) labs- [redacted] weeks gestation  New OB (NOB) physical examination  with provider- 11/[redacted] weeks gestation  Flu vaccine-[redacted] weeks gestation  Anatomy scan-[redacted] weeks gestation  Glucose tolerance test, blood work to test for anemia, T-dap vaccine-[redacted] weeks gestation  Vaginal swabs/cultures-STD/Group B strep-[redacted] weeks gestation  Appointments every 4 weeks until 28 weeks  Every 2 weeks from 28 weeks until 36 weeks  Weekly visits from 36 weeks until delivery   Pregnancy and Travel  Most pregnant women can safely travel until the last month of their pregnancy. Your doctor may recommend limiting or avoiding travel depending on how far you are in the pregnancy, and if you have any medical or pregnancy problems. General travel tips Before you go:  Discuss your trip with your doctor. Get examined shortly before you go.  Get a copy of your medical records. Take it with  you.  Try to get names of doctors and hospitals in the area where you will be visiting.  Pack your pillow.  Pack any approved medicines and supplements.  Get enough sleep the night before the trip. During your trip:  Ask for locations of doctors and hospitals.  Wear flat, comfortable shoes.  Wear loose-fitting, comfortable clothes.  Wear compression stockings as told by your doctor. They may prevent blood clots that arise from sitting for a long time.  Do leg exercises as told by your doctor.  Eat a balanced diet, drink lots of fluid, and take your vitamins and supplements.  Take water, crackers, and fruit with you.  Take breaks to use the restroom and walk every 2 hours or during stops.  Do not wear yourself out.  Do not ride on a motorcycle.  Rest. If your trip is long, lie down for 30 or more minutes with your feet slightly raised after you reach your destination.  Always wear a seat belt. Tips for traveling to a foreign country Before you go:  Ask your doctor if there are medicines that are safe for you to take if you get diarrhea, constipation, nausea, or vomiting.  Check with your health insurance provider about medical coverage abroad. Purchase travel [employer], if needed.  Make sure you are up to date on vaccines. During your trip:  Do not eat uncooked foods.  Do not eat food from buffets or food that is cold or sitting at room temperature.  Drink bottled beverages and water. Do not use ice.  Wash fruits and vegetables with clean water. If possible, peel them before eating.  Do not drink unpasteurized milk.  Wear insect repellent if there are mosquitoes or other biting insects. Ask your doctor which repellents are safe. What do I need to know about traveling by car?  Wear your seat belt properly. The belt should be buckled below your abdomen, on your hip bones. The shoulder belt should be off to the side of your abdomen and across the center of  your chest.  If you are in the front seat, sit as far away from the dashboard as possible to avoid getting hit hard if the airbag deploys in an accident.  Do not travel for more than 5-6 hours a day. What do I need to know about traveling by bus?  Before making a reservation, ask whether your bus will have a restroom.  Move your arms and legs when seated.  If you have to use the restroom, hold on to the seats and handrails as you walk.  Do not travel for more than 5-6 hours a day. What do I need to know about traveling  by train?  Before making a reservation, ask if your train will have a sleeping car and more than one restroom.  If you need to walk while the train is moving, hold on to seats and handrails.  Move your arms and legs when seated.  Do not travel for more than 5-6 hours a day. What do I need to know about traveling by airplane?  Before booking your trip, ask about the airline's rules about pregnancy. Pregnant women may be restricted from [place] after a certain time of the pregnancy. Every airline has its own rules.  Make sure you complete your trip before 36 weeks of pregnancy.  Ask whether the airplane cabin will be pressurized. Do not board an unpressurized plane that will fly above 7,000 ft (2,100 m).  Try to get a bulkhead or an aisle seat so it is easier to get up, stretch, and use the bathroom.  Wear layers since the cabin temperature can change.  Put all your medicines and medical records in your carry-on bag.  Avoid drinking caffeinated or carbonated beverages.  Avoid eating foods that may make you bloated.  Do not eat a big meal.  If you need to walk through the airplane, hold on to the seats and handrails.  Move your arms and legs when seated.  Wear your seat belt. What do I need to know about traveling by cruise ship?  Before booking your trip, ask the [place]: ? Are pregnant women allowed on the ship? ? Is there a medical  facility and doctor on board? ? Does the ship dock in places where there are doctors and medical facilities?  Before booking your trip, ask your doctor: ? Is it safe to take medicines if I get seasick? ? Is it safe to wear acupressure wristbands to prevent seasickness? If the answer is yes, consider buying one. Contact a health care provider if:  You have diarrhea.  You vomit.  You have nausea or seasickness. Get help right away if:  You have vaginal bleeding.  You have severe vomiting or diarrhea.  You have pelvic or abdominal pain.  You have contractions.  Your water breaks.  You have a persistent headache.  Your eyesight changes or you see spots.  Your face or hands are swollen.  You have pain, warmth, or swelling in your legs or ankles. Summary  Most pregnant women can safely travel until the last month of their pregnancy. Your doctor may tell you to limit or avoid travel depending on how far you are in your pregnancy and if you have any medical or pregnancy problems.  The best time to travel is between 14 and 28 weeks of your pregnancy.  Before you go on your trip, make sure you discuss your trip with your health care provider, get a copy of your medical records, and try to get information on medical centers and doctors at your destination.  While on your trip, make sure you wear comfortable clothes and shoes, eat a healthy diet, drink plenty of fluids, take your vitamins and supplements, take breaks and rest often, and wear your seat belt.  Before booking a flight, ask about the airline's rules about pregnancy. Every airline has its own rules. Do the same for a train, bus, or cruise ship. This information is not intended to replace advice given to you by your health care provider. Make sure you discuss any questions you have with your health care provider. Document Revised: 05/10/2019 Document Reviewed: 12/30/2016  Elsevier Patient Education  [employer].

## 2020-09-08 LAB — CBC
Hematocrit: 33.9 % — ABNORMAL LOW (ref 34.0–46.6)
Hemoglobin: 11.6 g/dL (ref 11.1–15.9)
MCH: 32.2 pg (ref 26.6–33.0)
MCHC: 34.2 g/dL (ref 31.5–35.7)
MCV: 94 fL (ref 79–97)
Platelets: 273 10*3/uL (ref 150–450)
RBC: 3.6 x10E6/uL — ABNORMAL LOW (ref 3.77–5.28)
RDW: 12.8 % (ref 11.7–15.4)
WBC: 10.6 10*3/uL (ref 3.4–10.8)

## 2020-09-08 LAB — RPR: RPR Ser Ql: NONREACTIVE

## 2020-09-08 LAB — GLUCOSE, 1 HOUR GESTATIONAL: Gestational Diabetes Screen: 68 mg/dL (ref 65–139)

## 2020-09-25 ENCOUNTER — Telehealth: Payer: Self-pay

## 2020-09-25 NOTE — Telephone Encounter (Signed)
Called pt and went over ob payment plan the pt stated that she is waiting for a check from her last ob. I told the pt we understand and if she cant make a payment at her next visit she can do it at the one after. The pt verbally understood.

## 2020-09-28 ENCOUNTER — Ambulatory Visit (INDEPENDENT_AMBULATORY_CARE_PROVIDER_SITE_OTHER): Payer: BC Managed Care – PPO | Admitting: Certified Nurse Midwife

## 2020-09-28 ENCOUNTER — Telehealth: Payer: Self-pay

## 2020-09-28 ENCOUNTER — Encounter: Payer: Self-pay | Admitting: Certified Nurse Midwife

## 2020-09-28 ENCOUNTER — Other Ambulatory Visit: Payer: Self-pay

## 2020-09-28 ENCOUNTER — Other Ambulatory Visit (HOSPITAL_COMMUNITY)
Admission: RE | Admit: 2020-09-28 | Discharge: 2020-09-28 | Disposition: A | Discharge status: Home / Self Care | Payer: BC Managed Care – PPO | Source: Ambulatory Visit | Attending: Certified Nurse Midwife | Admitting: Certified Nurse Midwife

## 2020-09-28 VITALS — BP 107/53 | HR 73 | Wt 188.0 lb

## 2020-09-28 DIAGNOSIS — Z3A31 31 weeks gestation of pregnancy: Secondary | ICD-10-CM | POA: Diagnosis present

## 2020-09-28 DIAGNOSIS — Z3483 Encounter for supervision of other normal pregnancy, third trimester: Secondary | ICD-10-CM | POA: Diagnosis present

## 2020-09-28 DIAGNOSIS — O26893 Other specified pregnancy related conditions, third trimester: Secondary | ICD-10-CM | POA: Insufficient documentation

## 2020-09-28 DIAGNOSIS — N898 Other specified noninflammatory disorders of vagina: Secondary | ICD-10-CM | POA: Diagnosis present

## 2020-09-28 LAB — POCT URINALYSIS DIPSTICK OB
Bilirubin, UA: NEGATIVE
Blood, UA: NEGATIVE
Glucose, UA: NEGATIVE
Nitrite, UA: NEGATIVE
POC,PROTEIN,UA: NEGATIVE
Spec Grav, UA: 1.02 (ref 1.010–1.025)
Urobilinogen, UA: 0.2 E.U./dL
pH, UA: 6 (ref 5.0–8.0)

## 2020-09-28 NOTE — Progress Notes (Signed)
ROB-Reports increased cloudy vaginal discharge and occasional itching. Swab collected today, will contact patient with results. Daughter diagnosed with lice; discussed home treatment measures. Waterbirth class certificate, consent form, and quiz submitted, see chart. Anticipatory guidance regarding course of prenatal care. Reviewed red flag symptoms and when to call. RTC x 2-3 weeks for ROB or sooner if needed.

## 2020-09-28 NOTE — Telephone Encounter (Signed)
When checking in this patient I informed her that she owed 80.00 and she stated that she had called her insurance company and that they told her if they billed through [name] rather than through [facility] that it would only be 25.00. Patient refuses to pay 80.00 and states that she would like to speak to someone regarding this matter as she would like her global maternity through [facility].   Could you please advise

## 2020-09-28 NOTE — Patient Instructions (Signed)
Third Trimester of Pregnancy  The third trimester is from week 28 through week 40 (months 7 through 9). This trimester is when your unborn baby (fetus) is growing very fast. At the end of the ninth month, the unborn baby is about 20 inches in length. It weighs about 6-10 pounds. Follow these instructions at home: Medicines  Take over-the-counter and prescription medicines only as told by your doctor. Some medicines are safe and some medicines are not safe during pregnancy.  Take a prenatal vitamin that contains at least 600 micrograms (mcg) of folic acid.  If you have trouble pooping (constipation), take medicine that will make your stool soft (stool softener) if your doctor approves. Eating and drinking   Eat regular, healthy meals.  Avoid raw meat and uncooked cheese.  If you get low calcium from the food you eat, talk to your doctor about taking a daily calcium supplement.  Eat four or five small meals rather than three large meals a day.  Avoid foods that are high in fat and sugars, such as fried and sweet foods.  To prevent constipation: ? Eat foods that are high in fiber, like fresh fruits and vegetables, whole grains, and beans. ? Drink enough fluids to keep your pee (urine) clear or pale yellow. Activity  Exercise only as told by your doctor. Stop exercising if you start to have cramps.  Avoid heavy lifting, wear low heels, and sit up straight.  Do not exercise if it is too hot, too humid, or if you are in a place of great height (high altitude).  You may continue to have sex unless your doctor tells you not to. Relieving pain and discomfort  Wear a good support bra if your breasts are tender.  Take frequent breaks and rest with your legs raised if you have leg cramps or low back pain.  Take warm water baths (sitz baths) to soothe pain or discomfort caused by hemorrhoids. Use hemorrhoid cream if your doctor approves.  If you develop puffy, bulging veins (varicose  veins) in your legs: ? Wear support hose or compression stockings as told by your doctor. ? Raise (elevate) your feet for 15 minutes, 3-4 times a day. ? Limit salt in your food. Safety  Wear your seat belt when driving.  Make a list of emergency phone numbers, including numbers for family, friends, the hospital, and police and fire departments. Preparing for your baby's arrival To prepare for the arrival of your baby:  Take prenatal classes.  Practice driving to the hospital.  Visit the hospital and tour the maternity area.  Talk to your work about taking leave once the baby comes.  Pack your hospital bag.  Prepare the baby's room.  Go to your doctor visits.  Buy a rear-facing car seat. Learn how to install it in your car. General instructions  Do not use hot tubs, steam rooms, or saunas.  Do not use any products that contain nicotine or tobacco, such as cigarettes and e-cigarettes. If you need help quitting, ask your doctor.  Do not drink alcohol.  Do not douche or use tampons or scented sanitary pads.  Do not cross your legs for long periods of time.  Do not travel for long distances unless you must. Only do so if your doctor says it is okay.  Visit your dentist if you have not gone during your pregnancy. Use a soft toothbrush to brush your teeth. Be gentle when you floss.  Avoid cat litter boxes and soil   used by cats. These carry germs that can cause birth defects in the baby and can cause a loss of your baby (miscarriage) or stillbirth.  Keep all your prenatal visits as told by your doctor. This is important. Contact a doctor if:  You are not sure if you are in labor or if your water has broken.  You are dizzy.  You have mild cramps or pressure in your lower belly.  You have a nagging pain in your belly area.  You continue to feel sick to your stomach, you throw up, or you have watery poop.  You have bad smelling fluid coming from your vagina.  You have  pain when you pee. Get help right away if:  You have a fever.  You are leaking fluid from your vagina.  You are spotting or bleeding from your vagina.  You have severe belly cramps or pain.  You lose or gain weight quickly.  You have trouble catching your breath and have chest pain.  You notice sudden or extreme puffiness (swelling) of your face, hands, ankles, feet, or legs.  You have not felt the baby move in over an hour.  You have severe headaches that do not go away with medicine.  You have trouble seeing.  You are leaking, or you are having a gush of fluid, from your vagina before you are 37 weeks.  You have regular belly spasms (contractions) before you are 37 weeks. Summary  The third trimester is from week 28 through week 40 (months 7 through 9). This time is when your unborn baby is growing very fast.  Follow your doctor's advice about medicine, food, and activity.  Get ready for the arrival of your baby by taking prenatal classes, getting all the baby items ready, preparing the baby's room, and visiting your doctor to be checked.  Get help right away if you are bleeding from your vagina, or you have chest pain and trouble catching your breath, or if you have not felt your baby move in over an hour. This information is not intended to replace advice given to you by your health care provider. Make sure you discuss any questions you have with your health care provider. Document Revised: 03/17/2019 Document Reviewed: 12/30/2016 Elsevier Patient Education  2020 Else[employer] Inc[employer]   Fetal Movement Counts Patient Name: ________________________________________________ Patient Due Date: ____________________ What is a fetal movement count?  A fetal movement count is the number of times that you feel your baby move during a certain amount of time. This may also be called a fetal kick count. A fetal movement count is recommended for every pregnant woman. You may be asked to  start counting fetal movements as early as week 28 of your pregnancy. Pay attention to when your baby is most active. You may notice your baby's sleep and wake cycles. You may also notice things that make your baby move more. You should do a fetal movement count:  When your baby is normally most active.  At the same time each day. A good time to count movements is while you are resting, after having something to eat and drink. How do I count fetal movements? 1. Find a quiet, comfortable area. Sit, or lie down on your side. 2. Write down the date, the start time and stop time, and the number of movements that you felt between those two times. Take this information with you to your health care visits. 3. Write down your start time when you feel   the first movement. 4. Count kicks, flutters, swishes, rolls, and jabs. You should feel at least 10 movements. 5. You may stop counting after you have felt 10 movements, or if you have been counting for 2 hours. Write down the stop time. 6. If you do not feel 10 movements in 2 hours, contact your health care provider for further instructions. Your health care provider may want to do additional tests to assess your baby's well-being. Contact a health care provider if:  You feel fewer than 10 movements in 2 hours.  Your baby is not moving like he or she usually does. Date: ____________ Start time: ____________ Stop time: ____________ Movements: ____________ Date: ____________ Start time: ____________ Stop time: ____________ Movements: ____________ Date: ____________ Start time: ____________ Stop time: ____________ Movements: ____________ Date: ____________ Start time: ____________ Stop time: ____________ Movements: ____________ Date: ____________ Start time: ____________ Stop time: ____________ Movements: ____________ Date: ____________ Start time: ____________ Stop time: ____________ Movements: ____________ Date: ____________ Start time: ____________  Stop time: ____________ Movements: ____________ Date: ____________ Start time: ____________ Stop time: ____________ Movements: ____________ Date: ____________ Start time: ____________ Stop time: ____________ Movements: ____________ This information is not intended to replace advice given to you by your health care provider. Make sure you discuss any questions you have with your health care provider. Document Revised: 07/14/2019 Document Reviewed: 07/14/2019 Elsevier Patient Education  2020 Elsevier Inc.  

## 2020-10-02 ENCOUNTER — Telehealth: Payer: Self-pay

## 2020-10-02 LAB — CERVICOVAGINAL ANCILLARY ONLY
Bacterial Vaginitis (gardnerella): POSITIVE — AB
Candida Glabrata: NEGATIVE
Candida Vaginitis: POSITIVE — AB
Comment: NEGATIVE
Comment: NEGATIVE
Comment: NEGATIVE

## 2020-10-02 NOTE — Telephone Encounter (Signed)
Call was somehow routed to my VM-  Pt states she has reviewed her labs and would like to have treatment as she is uncomfortable.   She is aware that JML is currently out of the office.   Pls advise.

## 2020-10-03 ENCOUNTER — Other Ambulatory Visit: Payer: Self-pay

## 2020-10-03 MED ORDER — FLUCONAZOLE 150 MG PO TABS: 150.0000 mg | ORAL_TABLET | Freq: Every day | ORAL | 0 refills | Status: DC

## 2020-10-03 MED ORDER — METRONIDAZOLE 500 MG PO TABS: 500.0000 mg | ORAL_TABLET | Freq: Two times a day (BID) | ORAL | 0 refills | Status: DC

## 2020-10-03 NOTE — Telephone Encounter (Signed)
Per AT, given the ok to send diflucan and flagyl to pt's pharmacy. Called pt- which she stated she had spoken to [facility] yesterday and a rx was sent to her pharmacy. Pt stated she has already started her medications.

## 2020-10-22 ENCOUNTER — Ambulatory Visit (INDEPENDENT_AMBULATORY_CARE_PROVIDER_SITE_OTHER): Payer: BC Managed Care – PPO | Admitting: Certified Nurse Midwife

## 2020-10-22 ENCOUNTER — Other Ambulatory Visit: Payer: Self-pay

## 2020-10-22 ENCOUNTER — Encounter: Payer: Self-pay | Admitting: Certified Nurse Midwife

## 2020-10-22 VITALS — BP 113/55 | HR 65 | Wt 192.3 lb

## 2020-10-22 DIAGNOSIS — Z3483 Encounter for supervision of other normal pregnancy, third trimester: Secondary | ICD-10-CM

## 2020-10-22 DIAGNOSIS — O99619 Diseases of the digestive system complicating pregnancy, unspecified trimester: Secondary | ICD-10-CM

## 2020-10-22 DIAGNOSIS — K219 Gastro-esophageal reflux disease without esophagitis: Secondary | ICD-10-CM

## 2020-10-22 DIAGNOSIS — Z3A34 34 weeks gestation of pregnancy: Secondary | ICD-10-CM

## 2020-10-22 LAB — POCT URINALYSIS DIPSTICK OB
Bilirubin, UA: NEGATIVE
Blood, UA: NEGATIVE
Glucose, UA: NEGATIVE
Ketones, UA: NEGATIVE
Leukocytes, UA: NEGATIVE
Nitrite, UA: NEGATIVE
POC,PROTEIN,UA: NEGATIVE
Spec Grav, UA: >=1.03 — AB (ref 1.010–1.025)
Urobilinogen, UA: 0.2 E.U./dL
pH, UA: 6 (ref 5.0–8.0)

## 2020-10-22 MED ORDER — PANTOPRAZOLE SODIUM 20 MG PO TBEC: 20.0000 mg | DELAYED_RELEASE_TABLET | Freq: Two times a day (BID) | ORAL | 1 refills | Status: DC

## 2020-10-22 NOTE — Patient Instructions (Signed)
Pantoprazole tablets What is this medicine? PANTOPRAZOLE (pan TOE pra zole) prevents the production of acid in the stomach. It is used to treat gastroesophageal reflux disease (GERD), inflammation of the esophagus, and Zollinger-Ellison syndrome. This medicine may be used for other purposes; ask your health care provider or pharmacist if you have questions. COMMON BRAND NAME(S): Protonix What should I tell my health care provider before I take this medicine? They need to know if you have any of these conditions:  liver disease  low levels of magnesium in the blood  lupus  an unusual or allergic reaction to omeprazole, lansoprazole, pantoprazole, rabeprazole, other medicines, foods, dyes, or preservatives  pregnant or trying to get pregnant  breast-feeding How should I use this medicine? Take this medicine by mouth. Swallow the tablets whole with a drink of water. Follow the directions on the prescription label. Do not crush, break, or chew. Take your medicine at regular intervals. Do not take your medicine more often than directed. Talk to your pediatrician regarding the use of this medicine in children. While this drug may be prescribed for children as young as 5 years for selected conditions, precautions do apply. Overdosage: If you think you have taken too much of this medicine contact a poison control center or emergency room at once. NOTE: This medicine is only for you. Do not share this medicine with others. What if I miss a dose? If you miss a dose, take it as soon as you can. If it is almost time for your next dose, take only that dose. Do not take double or extra doses. What may interact with this medicine? Do not take this medicine with any of the following medications:  atazanavir  nelfinavir This medicine may also interact with the following medications:  ampicillin  delavirdine  erlotinib  iron salts  medicines for fungal infections like ketoconazole,  itraconazole and voriconazole  methotrexate  mycophenolate mofetil  warfarin This list may not describe all possible interactions. Give your health care provider a list of all the medicines, herbs, non-prescription drugs, or dietary supplements you use. Also tell them if you smoke, drink alcohol, or use illegal drugs. Some items may interact with your medicine. What should I watch for while using this medicine? It can take several days before your stomach pain gets better. Check with your doctor or health care provider if your condition does not start to get better, or if it gets worse. This medicine may cause serious skin reactions. They can happen weeks to months after starting the medicine. Contact your health care provider right away if you notice fevers or flu-like symptoms with a rash. The rash may be red or purple and then turn into blisters or peeling of the skin. Or, you might notice a red rash with swelling of the face, lips or lymph nodes in your neck or under your arms. You may need blood work done while you are taking this medicine. This medicine may cause a decrease in vitamin B12. You should make sure that you get enough vitamin B12 while you are taking this medicine. Discuss the foods you eat and the vitamins you take with your health care provider. What side effects may I notice from receiving this medicine? Side effects that you should report to your doctor or health care professional as soon as possible:   allergic reactions like skin rash, itching or hives, swelling of the face, lips, or tongue   bone, muscle or joint pain   breathing problems  chest pain or chest tightness   dark yellow or brown urine   dizziness   fast, irregular heartbeat   feeling faint or lightheaded   fever or sore throat   muscle spasm   palpitations   rash on cheeks or arms that gets worse in the sun   redness, blistering, peeling or loosening of the skin, including  inside the mouth   seizures  stomach polyps   tremors   unusual bleeding or bruising   unusually weak or tired   yellowing of the eyes or skin Side effects that usually do not require medical attention (report to your doctor or health care professional if they continue or are bothersome):   constipation   diarrhea   dry mouth   headache   nausea This list may not describe all possible side effects. Call your doctor for medical advice about side effects. You may report side effects to FDA at 1-800-FDA-1088. Where should I keep my medicine? Keep out of the reach of children. Store at room temperature between 15 and 30 degrees C (59 and 86 degrees F). Protect from light and moisture. Throw away any unused medicine after the expiration date. NOTE: This sheet is a summary. It may not cover all possible information. If you have questions about this medicine, talk to your doctor, pharmacist, or health care provider.  2020 Elsevier/Gold Standard (2019-03-04 13:18:32)    Pregnancy and Travel  Most pregnant women can safely travel until the last month of their pregnancy. Your doctor may recommend limiting or avoiding travel depending on how far you are in the pregnancy, and if you have any medical or pregnancy problems. General travel tips Before you go:  Discuss your trip with your doctor. Get examined shortly before you go.  Get a copy of your medical records. Take it with you.  Try to get names of doctors and hospitals in the area where you will be visiting.  Pack your pillow.  Pack any approved medicines and supplements.  Get enough sleep the night before the trip. During your trip:  Ask for locations of doctors and hospitals.  Wear flat, comfortable shoes.  Wear loose-fitting, comfortable clothes.  Wear compression stockings as told by your doctor. They may prevent blood clots that arise from sitting for a long time.  Do leg exercises as told by your  doctor.  Eat a balanced diet, drink lots of fluid, and take your vitamins and supplements.  Take water, crackers, and fruit with you.  Take breaks to use the restroom and walk every 2 hours or during stops.  Do not wear yourself out.  Do not ride on a motorcycle.  Rest. If your trip is long, lie down for 30 or more minutes with your feet slightly raised after you reach your destination.  Always wear a seat belt. Tips for traveling to a foreign country Before you go:  Ask your doctor if there are medicines that are safe for you to take if you get diarrhea, constipation, nausea, or vomiting.  Check with your health insurance provider about medical coverage abroad. Purchase travel [employer], if needed.  Make sure you are up to date on vaccines. During your trip:  Do not eat uncooked foods.  Do not eat food from buffets or food that is cold or sitting at room temperature.  Drink bottled beverages and water. Do not use ice.  Wash fruits and vegetables with clean water. If possible, peel them before eating.  Do not  drink unpasteurized milk.  Wear insect repellent if there are mosquitoes or other biting insects. Ask your doctor which repellents are safe. What do I need to know about traveling by car?  Wear your seat belt properly. The belt should be buckled below your abdomen, on your hip bones. The shoulder belt should be off to the side of your abdomen and across the center of your chest.  If you are in the front seat, sit as far away from the dashboard as possible to avoid getting hit hard if the airbag deploys in an accident.  Do not travel for more than 5-6 hours a day. What do I need to know about traveling by bus?  Before making a reservation, ask whether your bus will have a restroom.  Move your arms and legs when seated.  If you have to use the restroom, hold on to the seats and handrails as you walk.  Do not travel for more than 5-6 hours a day. What do  I need to know about traveling by train?  Before making a reservation, ask if your train will have a sleeping car and more than one restroom.  If you need to walk while the train is moving, hold on to seats and handrails.  Move your arms and legs when seated.  Do not travel for more than 5-6 hours a day. What do I need to know about traveling by airplane?  Before booking your trip, ask about the airline's rules about pregnancy. Pregnant women may be restricted from flying after a certain time of the pregnancy. Every airline has its own rules.  Make sure you complete your trip before 36 weeks of pregnancy.  Ask whether the airplane cabin will be pressurized. Do not board an unpressurized plane that will fly above 7,000 ft (2,100 m).  Try to get a bulkhead or an aisle seat so it is easier to get up, stretch, and use the bathroom.  Wear layers since the cabin temperature can change.  Put all your medicines and medical records in your carry-on bag.  Avoid drinking caffeinated or carbonated beverages.  Avoid eating foods that may make you bloated.  Do not eat a big meal.  If you need to walk through the airplane, hold on to the seats and handrails.  Move your arms and legs when seated.  Wear your seat belt. What do I need to know about traveling by cruise ship?  Before booking your trip, ask the cruise ship company: ? Are pregnant women allowed on the ship? ? Is there a medical facility and doctor on board? ? Does the ship dock in places where there are doctors and medical facilities?  Before booking your trip, ask your doctor: ? Is it safe to take medicines if I get seasick? ? Is it safe to wear acupressure wristbands to prevent seasickness? If the answer is yes, consider buying one. Contact a health care provider if:  You have diarrhea.  You vomit.  You have nausea or seasickness. Get help right away if:  You have vaginal bleeding.  You have severe vomiting or  diarrhea.  You have pelvic or abdominal pain.  You have contractions.  Your water breaks.  You have a persistent headache.  Your eyesight changes or you see spots.  Your face or hands are swollen.  You have pain, warmth, or swelling in your legs or ankles. Summary  Most pregnant women can safely travel until the last month of their pregnancy. Your doctor  may tell you to limit or avoid travel depending on how far you are in your pregnancy and if you have any medical or pregnancy problems.  The best time to travel is between 14 and 28 weeks of your pregnancy.  Before you go on your trip, make sure you discuss your trip with your health care provider, get a copy of your medical records, and try to get information on medical centers and doctors at your destination.  While on your trip, make sure you wear comfortable clothes and shoes, eat a healthy diet, drink plenty of fluids, take your vitamins and supplements, take breaks and rest often, and wear your seat belt.  Before booking a flight, ask about the airline's rules about pregnancy. Every airline has its own rules. Do the same for a train, bus, or cruise ship. This information is not intended to replace advice given to you by your health care provider. Make sure you discuss any questions you have with your health care provider. Document Revised: 05/10/2019 Document Reviewed: 12/30/2016 Elsevier Patient Education  2020 [employer].

## 2020-10-22 NOTE — Progress Notes (Signed)
ROB-Reports increased reflux unrelieved by Pepcid and Tums. Rx Protonix, see orders. Traveling for Thanksgiving, discussed precautions and copy for records requested. Anticipatory guidance regarding course of prenatal care. Reviewed red flag symptoms and when to call. RTC x 2-3 weeks for ROB or sooner if needed.

## 2020-11-09 ENCOUNTER — Encounter: Payer: Self-pay | Admitting: Certified Nurse Midwife

## 2020-11-09 ENCOUNTER — Other Ambulatory Visit: Payer: Self-pay

## 2020-11-09 ENCOUNTER — Ambulatory Visit (INDEPENDENT_AMBULATORY_CARE_PROVIDER_SITE_OTHER): Payer: BC Managed Care – PPO | Admitting: Certified Nurse Midwife

## 2020-11-09 VITALS — BP 109/55 | HR 68 | Wt 197.5 lb

## 2020-11-09 DIAGNOSIS — Z3483 Encounter for supervision of other normal pregnancy, third trimester: Secondary | ICD-10-CM

## 2020-11-09 DIAGNOSIS — Z3A37 37 weeks gestation of pregnancy: Secondary | ICD-10-CM

## 2020-11-09 LAB — POCT URINALYSIS DIPSTICK OB
Bilirubin, UA: NEGATIVE
Blood, UA: NEGATIVE
Glucose, UA: NEGATIVE
Ketones, UA: NEGATIVE
Leukocytes, UA: NEGATIVE
Nitrite, UA: NEGATIVE
POC,PROTEIN,UA: NEGATIVE
Spec Grav, UA: 1.015 (ref 1.010–1.025)
Urobilinogen, UA: 0.2 E.U./dL
pH, UA: 6.5 (ref 5.0–8.0)

## 2020-11-09 NOTE — Patient Instructions (Addendum)
Fetal Movement Counts Patient Name: ________________________________________________ Patient Due Date: ____________________ What is a fetal movement count?  A fetal movement count is the number of times that you feel your baby move during a certain amount of time. This may also be called a fetal kick count. A fetal movement count is recommended for every pregnant woman. You may be asked to start counting fetal movements as early as week 28 of your pregnancy. Pay attention to when your baby is most active. You may notice your baby's sleep and wake cycles. You may also notice things that make your baby move more. You should do a fetal movement count:  When your baby is normally most active.  At the same time each day. A good time to count movements is while you are resting, after having something to eat and drink. How do I count fetal movements? 1. Find a quiet, comfortable area. Sit, or lie down on your side. 2. Write down the date, the start time and stop time, and the number of movements that you felt between those two times. Take this information with you to your health care visits. 3. Write down your start time when you feel the first movement. 4. Count kicks, flutters, swishes, rolls, and jabs. You should feel at least 10 movements. 5. You may stop counting after you have felt 10 movements, or if you have been counting for 2 hours. Write down the stop time. 6. If you do not feel 10 movements in 2 hours, contact your health care provider for further instructions. Your health care provider may want to do additional tests to assess your baby's well-being. Contact a health care provider if:  You feel fewer than 10 movements in 2 hours.  Your baby is not moving like he or she usually does. Date: ____________ Start time: ____________ Stop time: ____________ Movements: ____________ Date: ____________ Start time: ____________ Stop time: ____________ Movements: ____________ Date: ____________  Start time: ____________ Stop time: ____________ Movements: ____________ Date: ____________ Start time: ____________ Stop time: ____________ Movements: ____________ Date: ____________ Start time: ____________ Stop time: ____________ Movements: ____________ Date: ____________ Start time: ____________ Stop time: ____________ Movements: ____________ Date: ____________ Start time: ____________ Stop time: ____________ Movements: ____________ Date: ____________ Start time: ____________ Stop time: ____________ Movements: ____________ Date: ____________ Start time: ____________ Stop time: ____________ Movements: ____________ This information is not intended to replace advice given to you by your health care provider. Make sure you discuss any questions you have with your health care provider. Document Revised: 07/14/2019 Document Reviewed: 07/14/2019 Elsevier Patient Education  2020 Elsevier Inc.  

## 2020-11-09 NOTE — Progress Notes (Signed)
Pt present for routine prenatal visit. No complaints. °

## 2020-11-09 NOTE — Progress Notes (Signed)
ROB-Doing well, questions fetal position. Vertex by abdominal palpation. 36 week cultures collected, see orders. Anticipatory guidance regarding course of prenatal care. Reviewed red flag symptoms and when to call. Reviewed red flag symptoms and when to call. RTC x 1 week for ROB or sooner if needed.

## 2020-11-11 LAB — STREP GP B NAA: Strep Gp B NAA: NEGATIVE

## 2020-11-12 ENCOUNTER — Encounter: Payer: BC Managed Care – PPO | Admitting: Certified Nurse Midwife

## 2020-11-13 ENCOUNTER — Other Ambulatory Visit: Payer: Self-pay | Admitting: Certified Nurse Midwife

## 2020-11-13 LAB — GC/CHLAMYDIA PROBE AMP
Chlamydia trachomatis, NAA: NEGATIVE
Neisseria Gonorrhoeae by PCR: NEGATIVE

## 2020-11-15 ENCOUNTER — Telehealth: Payer: Self-pay

## 2020-11-15 ENCOUNTER — Encounter: Payer: BC Managed Care – PPO | Admitting: Certified Nurse Midwife

## 2020-11-15 ENCOUNTER — Other Ambulatory Visit: Payer: Self-pay

## 2020-11-15 NOTE — Telephone Encounter (Signed)
err

## 2020-11-16 NOTE — Telephone Encounter (Signed)
Please provide patient with most recent copy of [facility] COVID Policy. Thanks, JML

## 2020-11-21 ENCOUNTER — Other Ambulatory Visit: Payer: Self-pay

## 2020-11-21 ENCOUNTER — Ambulatory Visit (INDEPENDENT_AMBULATORY_CARE_PROVIDER_SITE_OTHER): Payer: BC Managed Care – PPO | Admitting: Obstetrics and Gynecology

## 2020-11-21 ENCOUNTER — Encounter: Payer: Self-pay | Admitting: Obstetrics and Gynecology

## 2020-11-21 VITALS — BP 113/73 | HR 79 | Wt 194.5 lb

## 2020-11-21 DIAGNOSIS — O479 False labor, unspecified: Secondary | ICD-10-CM

## 2020-11-21 DIAGNOSIS — Z3483 Encounter for supervision of other normal pregnancy, third trimester: Secondary | ICD-10-CM

## 2020-11-21 DIAGNOSIS — Z3A38 38 weeks gestation of pregnancy: Secondary | ICD-10-CM

## 2020-11-21 LAB — POCT URINALYSIS DIPSTICK OB
Bilirubin, UA: NEGATIVE
Blood, UA: NEGATIVE
Glucose, UA: NEGATIVE
Ketones, UA: NEGATIVE
Leukocytes, UA: NEGATIVE
Nitrite, UA: NEGATIVE
POC,PROTEIN,UA: NEGATIVE
Spec Grav, UA: 1.01 (ref 1.010–1.025)
Urobilinogen, UA: 0.2 E.U./dL
pH, UA: 7 (ref 5.0–8.0)

## 2020-11-21 NOTE — Progress Notes (Signed)
Problem OB: Patient is of midwifery service, seen today due to complaints of contractions ongoing since last night. Reports that it went on for several hours yesterday evening around dinner time irregular q 3-6 min, but then slowed down as she was preparing to go to the hospital.  Ctx picked up again this morning, have been ~ q 15 min.  Discussed prodromal labor/latent phase with patient. Given advice on how to continue to advance labor should contractions continue to increase in frequency or intensity. RTC for routine OB visit with Elizabeth Harding as previously scheduled.

## 2020-11-21 NOTE — Patient Instructions (Signed)

## 2020-11-21 NOTE — Progress Notes (Signed)
ROB-PT present due to having having watery vaginal discharge, lower abd pain and contractions.

## 2020-11-22 ENCOUNTER — Encounter: Payer: BC Managed Care – PPO | Admitting: Certified Nurse Midwife

## 2020-11-27 ENCOUNTER — Other Ambulatory Visit: Payer: Self-pay

## 2020-11-27 ENCOUNTER — Inpatient Hospital Stay
Admission: EM | Admit: 2020-11-27 | Discharge: 2020-11-29 | DRG: 807 | Disposition: A | Discharge status: Home / Self Care | Payer: BC Managed Care – PPO | Attending: Certified Nurse Midwife | Admitting: Certified Nurse Midwife

## 2020-11-27 ENCOUNTER — Encounter: Payer: Self-pay | Admitting: Certified Nurse Midwife

## 2020-11-27 ENCOUNTER — Ambulatory Visit (INDEPENDENT_AMBULATORY_CARE_PROVIDER_SITE_OTHER): Payer: BC Managed Care – PPO | Admitting: Certified Nurse Midwife

## 2020-11-27 DIAGNOSIS — Z3A39 39 weeks gestation of pregnancy: Secondary | ICD-10-CM

## 2020-11-27 DIAGNOSIS — Z20822 Contact with and (suspected) exposure to covid-19: Secondary | ICD-10-CM | POA: Diagnosis present

## 2020-11-27 DIAGNOSIS — O26893 Other specified pregnancy related conditions, third trimester: Secondary | ICD-10-CM | POA: Diagnosis present

## 2020-11-27 DIAGNOSIS — Z6791 Unspecified blood type, Rh negative: Secondary | ICD-10-CM

## 2020-11-27 DIAGNOSIS — O9952 Diseases of the respiratory system complicating childbirth: Secondary | ICD-10-CM | POA: Diagnosis present

## 2020-11-27 DIAGNOSIS — J45909 Unspecified asthma, uncomplicated: Secondary | ICD-10-CM | POA: Diagnosis present

## 2020-11-27 DIAGNOSIS — O360130 Maternal care for anti-D [Rh] antibodies, third trimester, not applicable or unspecified: Secondary | ICD-10-CM | POA: Diagnosis not present

## 2020-11-27 LAB — CBC
HCT: 31.6 % — ABNORMAL LOW (ref 36.0–46.0)
Hemoglobin: 10.8 g/dL — ABNORMAL LOW (ref 12.0–15.0)
MCH: 30.9 pg (ref 26.0–34.0)
MCHC: 34.2 g/dL (ref 30.0–36.0)
MCV: 90.5 fL (ref 80.0–100.0)
Platelets: 254 10*3/uL (ref 150–400)
RBC: 3.49 MIL/uL — ABNORMAL LOW (ref 3.87–5.11)
RDW: 14.2 % (ref 11.5–15.5)
WBC: 10 10*3/uL (ref 4.0–10.5)
nRBC: 0 % (ref 0.0–0.2)

## 2020-11-27 LAB — POCT URINALYSIS DIPSTICK OB
Bilirubin, UA: NEGATIVE
Blood, UA: NEGATIVE
Glucose, UA: NEGATIVE
Ketones, UA: NEGATIVE
Leukocytes, UA: NEGATIVE
Nitrite, UA: NEGATIVE
POC,PROTEIN,UA: NEGATIVE
Spec Grav, UA: 1.01 (ref 1.010–1.025)
Urobilinogen, UA: 0.2 E.U./dL
pH, UA: 5 (ref 5.0–8.0)

## 2020-11-27 MED ORDER — OXYCODONE-ACETAMINOPHEN 5-325 MG PO TABS: 2.0000 | ORAL_TABLET | ORAL | Status: DC | PRN

## 2020-11-27 MED ORDER — LIDOCAINE HCL (PF) 1 % IJ SOLN
30.0000 mL | INTRAMUSCULAR | Status: AC | PRN
  Administered 2020-11-28: 30 mL via SUBCUTANEOUS

## 2020-11-27 MED ORDER — MISOPROSTOL 200 MCG PO TABS
ORAL_TABLET | ORAL | Status: AC
  Filled 2020-11-27: qty 4

## 2020-11-27 MED ORDER — BUTORPHANOL TARTRATE 1 MG/ML IJ SOLN: 1.0000 mg | INTRAMUSCULAR | Status: DC | PRN

## 2020-11-27 MED ORDER — ONDANSETRON HCL 4 MG/2ML IJ SOLN: 4.0000 mg | Freq: Four times a day (QID) | INTRAMUSCULAR | Status: DC | PRN

## 2020-11-27 MED ORDER — AMMONIA AROMATIC IN INHA
RESPIRATORY_TRACT | Status: AC
  Filled 2020-11-27: qty 10

## 2020-11-27 MED ORDER — OXYTOCIN 10 UNIT/ML IJ SOLN
INTRAMUSCULAR | Status: AC
  Filled 2020-11-27: qty 2

## 2020-11-27 MED ORDER — OXYCODONE-ACETAMINOPHEN 5-325 MG PO TABS: 1.0000 | ORAL_TABLET | ORAL | Status: DC | PRN

## 2020-11-27 MED ORDER — ACETAMINOPHEN 325 MG PO TABS: 650.0000 mg | ORAL_TABLET | ORAL | Status: DC | PRN

## 2020-11-27 MED ORDER — OXYTOCIN-SODIUM CHLORIDE 30-0.9 UT/500ML-% IV SOLN
INTRAVENOUS | Status: AC
  Filled 2020-11-27: qty 500

## 2020-11-27 MED ORDER — LIDOCAINE HCL (PF) 1 % IJ SOLN
INTRAMUSCULAR | Status: AC
  Filled 2020-11-27: qty 30

## 2020-11-27 MED ORDER — LACTATED RINGERS IV SOLN: 500.0000 mL | INTRAVENOUS | Status: DC | PRN

## 2020-11-27 NOTE — Patient Instructions (Signed)
Braxton Hicks Contractions °Contractions of the uterus can occur throughout pregnancy, but they are not always a sign that you are in labor. You may have practice contractions called Braxton Hicks contractions. These false labor contractions are sometimes confused with true labor. °What are Braxton Hicks contractions? °Braxton Hicks contractions are tightening movements that occur in the muscles of the uterus before labor. Unlike true labor contractions, these contractions do not result in opening (dilation) and thinning of the cervix. Toward the end of pregnancy (32-34 weeks), Braxton Hicks contractions can happen more often and may become stronger. These contractions are sometimes difficult to tell apart from true labor because they can be very uncomfortable. You should not feel embarrassed if you go to the hospital with false labor. °Sometimes, the only way to tell if you are in true labor is for your health care provider to look for changes in the cervix. The health care provider will do a physical exam and may monitor your contractions. If you are not in true labor, the exam should show that your cervix is not dilating and your water has not broken. °If there are no other health problems associated with your pregnancy, it is completely safe for you to be sent home with false labor. You may continue to have Braxton Hicks contractions until you go into true labor. °How to tell the difference between true labor and false labor °True labor °· Contractions last 30-70 seconds. °· Contractions become very regular. °· Discomfort is usually felt in the top of the uterus, and it spreads to the lower abdomen and low back. °· Contractions do not go away with walking. °· Contractions usually become more intense and increase in frequency. °· The cervix dilates and gets thinner. °False labor °· Contractions are usually shorter and not as strong as true labor contractions. °· Contractions are usually irregular. °· Contractions  are often felt in the front of the lower abdomen and in the groin. °· Contractions may go away when you walk around or change positions while lying down. °· Contractions get weaker and are shorter-lasting as time goes on. °· The cervix usually does not dilate or become thin. °Follow these instructions at home: ° °· Take over-the-counter and prescription medicines only as told by your health care provider. °· Keep up with your usual exercises and follow other instructions from your health care provider. °· Eat and drink lightly if you think you are going into labor. °· If Braxton Hicks contractions are making you uncomfortable: °? Change your position from lying down or resting to walking, or change from walking to resting. °? Sit and rest in a tub of warm water. °? Drink enough fluid to keep your urine pale yellow. Dehydration may cause these contractions. °? Do slow and deep breathing several times an hour. °· Keep all follow-up prenatal visits as told by your health care provider. This is important. °Contact a health care provider if: °· You have a fever. °· You have continuous pain in your abdomen. °Get help right away if: °· Your contractions become stronger, more regular, and closer together. °· You have fluid leaking or gushing from your vagina. °· You pass blood-tinged mucus (bloody show). °· You have bleeding from your vagina. °· You have low back pain that you never had before. °· You feel your baby’s head pushing down and causing pelvic pressure. °· Your baby is not moving inside you as much as it used to. °Summary °· Contractions that occur before labor are   called Braxton Hicks contractions, false labor, or practice contractions. °· Braxton Hicks contractions are usually shorter, weaker, farther apart, and less regular than true labor contractions. True labor contractions usually become progressively stronger and regular, and they become more frequent. °· Manage discomfort from Braxton Hicks contractions  by changing position, resting in a warm bath, drinking plenty of water, or practicing deep breathing. °This information is not intended to replace advice given to you by your health care provider. Make sure you discuss any questions you have with your health care provider. °Document Revised: 11/06/2017 Document Reviewed: 04/09/2017 °Elsevier Patient Education © 2020 Elsevier Inc. ° °

## 2020-11-27 NOTE — H&P (Signed)
Obstetric History and Physical  Evadna Donaghy is a 34 y.o. [phone] with IUP at [redacted]w[redacted]d presenting with contractions since earlier today.   Patient states she has been having regular, every few minutes contractions, none vaginal bleeding, intact membranes, with active fetal movement.    Denies difficulty breathing or respiratory distress, chest pain, dysuria, and leg pain.   Prenatal Course  Source of Care: [facility]-Transfer of care from [facility] at 20 weeks, total visits: 8  Pregnancy complications or risks: Rh negative, History of anxiety  Prenatal labs and studies:  ABO, Rh: O negative (06/08 0000)  Antibody: Negatve (06/08 0000)  Rubella: Immune (06/08 0000)  RPR: Non Reactive (10/01 0937)   HBsAg: Negative (06/08 0000)   HIV: Non-reactive (06/08 0000)   YTK:PTWSFKCL/-- (12/03 1610)  1 hr Glucola: 68 (10/01 0937)  Genetic screening: Declined  Anatomy US: Complete, normal (08/05 0945)  Past Medical History:  Diagnosis Date  . Allergic rhinitis due to pollen    hay fever  . Anxiety    with aggitation and anger  . Asthma   . Chicken pox   . Irregular intermenstrual bleeding   . Jaundice    as a newborn  . Traumatic injury during pregnancy in third trimester 12/24/2015    Past Surgical History:  Procedure Laterality Date  . 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 WISDOM TOOTH EXTRACTION      OB History  Gravida Para Term Preterm AB Living  4 2 2   1 2   SAB IAB Ectopic Multiple Live Births  1     0 2    # Outcome Date GA Lbr Len/2nd Weight Sex Delivery Anes PTL Lv  4 Current           3 Term 08/19/17 [redacted]w[redacted]d 12:10 / 00:01 3135 g F Vag-Spont Local  LIV  2 Term 02/27/16 [redacted]w[redacted]d 13:05 / 01:44 3771 g M Vag-Spont Local  LIV  1 SAB             Social History   Socioeconomic History  . Marital status: Married    Spouse name: Not on file  . Number of children: Not on file  . Years of education: Not  on file  . Highest education level: Not on file  Occupational History  . Not on file  Tobacco Use  . Smoking status: Never Smoker  . Smokeless tobacco: Never Used  Vaping Use  . Vaping Use: Never used  Substance and Sexual Activity  . Alcohol use: Not Currently    Comment: occas  . Drug use: No  . Sexual activity: Yes  Other Topics Concern  . Not on file  Social History Narrative   Lives in [place] with family; husband and two (2) kids. Has cat. Teacher.      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 Hypertension Other   . Heart disease Paternal Grandmother   . Rheum arthritis Paternal Grandmother   . Rheum arthritis Maternal Grandmother   . Heart disease Paternal Grandfather     Facility-Administered Medications Prior to Admission  Medication Dose  Route Frequency Provider Last Rate Last Admin  . rho (d) immune globulin (RHIG/RHOPHYLAC) injection 300 mcg  300 mcg Intramuscular Once Gunnar Bulla, CNM       Medications Prior to Admission  Medication Sig Dispense Refill Last Dose  . Lactase (LACTAID FAST ACT PO) Take 1 tablet by mouth daily as needed (lactose intolerance).   11/27/2020 at Unknown time  . Lactobacillus (PROBIOTIC GOLD EXTRA STRENGTH PO) Take by mouth.   11/26/2020 at Unknown time  . pantoprazole (PROTONIX) 20 MG tablet Take 1 tablet (20 mg total) by mouth 2 (two) times daily. 60 tablet 1 11/27/2020 at Unknown time  . Prenatal Vit-Fe Fumarate-FA (PRENATAL MULTIVITAMIN) TABS tablet Take 1 tablet by mouth at bedtime.   11/26/2020 at Unknown time  . albuterol (VENTOLIN HFA) 108 (90 Base) MCG/ACT inhaler Inhale 2 puffs into the lungs every 6 (six) hours as needed for  wheezing or shortness of breath. (Patient not taking: Reported on 11/27/2020) 6.7 g 2 Not Taking at Unknown time  . Guaifenesin 1200 MG TB12        Allergies  Allergen Reactions  . Sunscreens Rash    Review of Systems: Negative except for what is mentioned in HPI.  Physical Exam:  BP 115/67 (BP Location: Right Arm)   Pulse 60   Temp 98.5 F (36.9 C) (Oral)   Resp 16   Ht 5\' 8"  (1.727 m)   Wt 86.6 kg   LMP 02/23/2020   BMI 29.04 kg/m   GENERAL: Well-developed, well-nourished female in no acute distress.   LUNGS: Clear to auscultation bilaterally.   HEART: Regular rate and rhythm.  ABDOMEN: Soft, nontender, nondistended, gravid.  EXTREMITIES: Nontender, no edema, 2+ distal pulses.  Cervical Exam: Dilation: 6.5 Station: -2 Presentation: Vertex Exam by:: [email or web address], CNM   FHR Category I  Contractions: Every three (3) to four (4) minutes, soft resting tone   Pertinent Labs/Studies:   Results for orders placed or performed during the hospital encounter of 11/27/20 (from the past 24 hour(s))  CBC     Status: Abnormal   Collection Time: 11/27/20 10:57 PM  Result Value Ref Range   WBC 10.0 4.0 - 10.5 K/uL   RBC 3.49 (L) 3.87 - 5.11 MIL/uL   Hemoglobin 10.8 (L) 12.0 - 15.0 g/dL   HCT 11/29/20 (L) 66.5 - 99.3 %   MCV 90.5 80.0 - 100.0 fL   MCH 30.9 26.0 - 34.0 pg   MCHC 34.2 30.0 - 36.0 g/dL   RDW 57.0 17.7 - 93.9 %   Platelets 254 150 - 400 K/uL   nRBC 0.0 0.0 - 0.2 %    Assessment : Izabellah Dadisman is a 34 y.o. 20 at [redacted]w[redacted]d being admitted for labor.  Plan:  Admit to birthing suites, see orders.   Labor: Expectant management.  Induction/Augmentation as needed, per protocol.  Delivery plan: Hopeful for vaginal birth in labor tub.   Dr. [redacted]w[redacted]d notified of admission and plan of care.    Logan Bores, CNM [facility], [facility] 11/27/20 11:53 PM

## 2020-11-27 NOTE — Progress Notes (Signed)
   11/27/20 2250  Provider Notification  Provider Name/Title Jeralyn Bennett, CNM  Method of Notification Phone  Request Evaluate in person  Notification Reason Order request (new patient arrival)    New patient arrived to unit for anticipated delivery. Patient states she was seen earlier in outpatient OB office and was noted to be 5cm. Patient reports she has been contracting since then. Patient has birth plan for water birth with CNM and limited monitoring and no IV unless needed. Patient agreeable to being monitored for NST. CNM gave telephone order for patient  for cervical exam (if needed).

## 2020-11-27 NOTE — Progress Notes (Signed)
ROB doing well. Feels good movement . FHT-130's .SHe has had contractions on and off . Has difficulty feeling them when she is moving around. She denies leaking of fluid or bloody show. Notes increased vaginal discharge. SVE 5cm /60/2 station. She appears comfortable and is talking with out difficulty. Abdomen is soft. Reviewed labor precautions. She is going to go home for a bit and have dinner with her family. Discussed going to hospital for an increase in pelvic pressure , leaking of fluid , or intensifying ctx. She verbalizes understanding and agreement. Follow up as scheduled with Marcelino Duster on Thursday.   Doreene Burke, CNM

## 2020-11-28 ENCOUNTER — Encounter: Payer: Self-pay | Admitting: Certified Nurse Midwife

## 2020-11-28 DIAGNOSIS — Z3A39 39 weeks gestation of pregnancy: Secondary | ICD-10-CM

## 2020-11-28 DIAGNOSIS — O360130 Maternal care for anti-D [Rh] antibodies, third trimester, not applicable or unspecified: Secondary | ICD-10-CM

## 2020-11-28 LAB — RESP PANEL BY RT-PCR (FLU A&B, COVID) ARPGX2
Influenza A by PCR: NEGATIVE
Influenza B by PCR: NEGATIVE
SARS Coronavirus 2 by RT PCR: NEGATIVE

## 2020-11-28 LAB — ABO/RH: ABO/RH(D): O NEG

## 2020-11-28 LAB — RPR: RPR Ser Ql: NONREACTIVE

## 2020-11-28 MED ORDER — BENZOCAINE-MENTHOL 20-0.5 % EX AERO: 1.0000 "application " | INHALATION_SPRAY | CUTANEOUS | Status: DC | PRN

## 2020-11-28 MED ORDER — ONDANSETRON HCL 4 MG PO TABS: 4.0000 mg | ORAL_TABLET | ORAL | Status: DC | PRN

## 2020-11-28 MED ORDER — WITCH HAZEL-GLYCERIN EX PADS: 1.0000 "application " | MEDICATED_PAD | CUTANEOUS | Status: DC | PRN

## 2020-11-28 MED ORDER — OXYTOCIN 10 UNIT/ML IJ SOLN
10.0000 [IU] | Freq: Once | INTRAMUSCULAR | Status: AC
  Administered 2020-11-28: 10 [IU] via INTRAMUSCULAR

## 2020-11-28 MED ORDER — COCONUT OIL OIL
1.0000 "application " | TOPICAL_OIL | Status: DC | PRN
  Filled 2020-11-28: qty 120

## 2020-11-28 MED ORDER — ALBUTEROL SULFATE HFA 108 (90 BASE) MCG/ACT IN AERS
2.0000 | INHALATION_SPRAY | Freq: Four times a day (QID) | RESPIRATORY_TRACT | Status: DC | PRN
  Filled 2020-11-28: qty 6.7

## 2020-11-28 MED ORDER — ONDANSETRON HCL 4 MG/2ML IJ SOLN: 4.0000 mg | INTRAMUSCULAR | Status: DC | PRN

## 2020-11-28 MED ORDER — ACETAMINOPHEN 325 MG PO TABS: 650.0000 mg | ORAL_TABLET | ORAL | Status: DC | PRN

## 2020-11-28 MED ORDER — IBUPROFEN 600 MG PO TABS
600.0000 mg | ORAL_TABLET | Freq: Four times a day (QID) | ORAL | Status: DC
  Administered 2020-11-28 – 2020-11-29 (×4): 600 mg via ORAL
  Filled 2020-11-28 (×4): qty 1

## 2020-11-28 MED ORDER — SIMETHICONE 80 MG PO CHEW: 80.0000 mg | CHEWABLE_TABLET | ORAL | Status: DC | PRN

## 2020-11-28 MED ORDER — DIBUCAINE (PERIANAL) 1 % EX OINT: 1.0000 "application " | TOPICAL_OINTMENT | CUTANEOUS | Status: DC | PRN

## 2020-11-28 MED ORDER — DIPHENHYDRAMINE HCL 25 MG PO CAPS: 25.0000 mg | ORAL_CAPSULE | Freq: Four times a day (QID) | ORAL | Status: DC | PRN

## 2020-11-28 NOTE — Progress Notes (Signed)
Patient ID: Elizabeth Harding, female   DOB: 1986/11/18, 34 y.o.   MRN: [id number]  Elizabeth Harding is a 35 y.o. [id number] at [redacted]w[redacted]d by LMP admitted for active labor  Subjective:  Patient sitting in bed, breathing through contractions. Considering return to birthing tub.   FOB and doula at bedside for continuous labor support.   Denies difficulty breathing or respiratory distress, chest pain, dysuria, and leg pain or swelling.   Objective:  Temp:  [98.1 F (36.7 C)-98.5 F (36.9 C)] 98.1 F (36.7 C) (12/22 0919) Pulse Rate:  [60-84] 84 (12/22 0919) Resp:  [14-18] 16 (12/22 0919) BP: (113-137)/(36-67) 113/52 (12/22 0919) Weight:  [86.6 kg] 86.6 kg (12/21 2339)  Fetal Wellbeing:  Category I  UC:   irregular, every five (5) to seven (7) minutes; soft resting tone  SVE:   Dilation: 7 Effacement (%): 60 Station: -2 Exam by:: Willodean Rosenthal, CNM  Labs: Lab Results  Component Value Date   WBC 10.0 11/27/2020   HGB 10.8 (L) 11/27/2020   HCT 31.6 (L) 11/27/2020   MCV 90.5 11/27/2020   PLT 254 11/27/2020    Assessment:  Elizabeth Harding a 69 y.V.O5F2924 at [redacted]w[redacted]d admitted for labor, Rh negative, GBS negative   FHR Category I  Plan:  Tub emptied and refilled with warm water.   Encouraged position change and use of peanut ball, patient continues to declined.   Reviewed red flag symptoms and when to call.   Continue orders as written. Reassess as needed.    Elizabeth Harding, CNM [facility], [facility] 11/28/2020, 10:03 AM

## 2020-11-28 NOTE — Progress Notes (Signed)
Patient ID: Elizabeth Harding, female   DOB: Oct 10, 1986, 34 y.o.   MRN: [id number]  Elizabeth Harding is a 34 y.o. [id number] at [redacted]w[redacted]d by LMP admitted for active labor  Subjective:  Patient sitting in bed, reports increased intensity and frequency of contractions after getting out of tub.   Doula and FOB at bedside for continuous labor support.   Denies difficulty breathing or respiratory distress, chest pain, dysuria, and leg pain or swelling.   Objective:  Temp:  [98.5 F (36.9 C)] 98.5 F (36.9 C) (12/21 2258) Pulse Rate:  [60] 60 (12/21 2258) Resp:  [16] 16 (12/21 2258) BP: (115)/(67) 115/67 (12/21 2258) Weight:  [86.6 kg] 86.6 kg (12/21 2339)  Fetal Wellbeing:  Category I  UC: irregular, every two (2) to nine (9) minutes; soft resting tone  SVE:   Dilation: 6.5 Station: -2 Exam by:: Willodean Rosenthal, CNM  Labs: Lab Results  Component Value Date   WBC 10.0 11/27/2020   HGB 10.8 (L) 11/27/2020   HCT 31.6 (L) 11/27/2020   MCV 90.5 11/27/2020   PLT 254 11/27/2020    Assessment:  Elizabeth Harding is a 34 y.o. [id number] at [redacted]w[redacted]d admitted for labor, Rh negative, GBS negative   FHR Category I  Plan:  Encouraged position change and use of peanut ball.   Continue orders as written. Reassess as needed.    [name], CNM [facility], [facility] 11/28/2020, 3:46 AM

## 2020-11-28 NOTE — Progress Notes (Signed)
Patient ID: Elizabeth Harding, female   DOB: [birth year], 34 y.o.   MRN: [id number]  Elizabeth Harding is a 34 y.o. [id number] at [redacted]w[redacted]d by LMP admitted for active labor  Subjective:  Patient standing, leaning against bed. Reports increased contractions strength and frequency.   FOB and doula present at bedside for continuous labor support.   Denies difficulty breathing or respiratory distress, chest pain, dysuria, and leg pain or swelling.   Objective:  Temp:  [98.4 F (36.9 C)-98.5 F (36.9 C)] 98.4 F (36.9 C) (12/22 0607) Pulse Rate:  [60-84] 84 (12/22 0608) Resp:  [14-16] 14 (12/22 6256) BP: (115-122)/(36-67) 122/60 (12/22 3893) Weight:  [86.6 kg] 86.6 kg (12/21 2339)  Fetal Wellbeing:  Category I  UC:   irregular, every four (4) to five (5) minutes; soft resting tone  SVE:   Dilation: 7 Effacement (%): 60 Station: -2 Exam by:: Elizabeth Harding, CNM  AROM clear, small amount  Labs: Lab Results  Component Value Date   WBC 10.0 11/27/2020   HGB 10.8 (L) 11/27/2020   HCT 31.6 (L) 11/27/2020   MCV 90.5 11/27/2020   PLT 254 11/27/2020    Assessment:  Elizabeth Harding a 34 y.T.D4K8768 at [redacted]w[redacted]d admitted for labor, Rh negative, GBS negative   FHR Category I  Plan:  AROM without difficulty.   Encouraged position change and use of peanut ball.   Patient plans to return to tub for labor, temperature 100 F.   Continue orders as written. Reassess as needed.    Elizabeth Harding, CNM [facility], [facility] 11/28/2020, 6:35 AM

## 2020-11-28 NOTE — Lactation Note (Signed)
This note was copied from a baby's chart. Lactation Consultation Note  Patient Name: Elizabeth Harding [id number] Date: 11/28/2020 Reason for consult: Initial assessment;Term  Initial lactation visit on MBU. This is mom's third baby, with reported extensive BF history- 32 months total with first (tandem nursed with first and second) and over 20 months with her second. Her second child did have a tongue/lip tie, tongue was released and immediate improvement in BF, and is on the lookout for repeat clues of restriction. Baby had not latched prior to [facility] entering room, but mom was trying. Mom got baby to latch easily in cradle hold. Baby appeared to have wide open mouth and rhythmic sucking pattern, 2-3 swallows heard.  Reminded parents of BF basics: newborn stomach size, feeding patterns and behaviors, output expectations, early cues and feeding on demand. Education given for at least 8 attempts in the first 24 hours, hand expression between feedings as needed and used to help encourage feeding. Parents had no questions/concerns. Encouraged to reach out as needed.  Maternal Data Formula Feeding for Exclusion: No Has patient been taught Hand Expression?: Yes (Mom remembers from BF other children) Does the patient have breastfeeding experience prior to this delivery?: Yes  Feeding Feeding Type: Breast Fed  LATCH Score Latch: Grasps breast easily, tongue down, lips flanged, rhythmical sucking.  Audible Swallowing: A few with stimulation  Type of Nipple: Everted at rest and after stimulation  Comfort (Breast/Nipple): Soft / non-tender  Hold (Positioning): No assistance needed to correctly position infant at breast.  LATCH Score: 9  Interventions Interventions: Breast feeding basics reviewed  Lactation Tools Discussed/Used     Consult Status Consult Status: PRN    Danford Bad 11/28/2020, 2:22 PM

## 2020-11-29 ENCOUNTER — Inpatient Hospital Stay: Admit: 2020-11-29 | Payer: Self-pay

## 2020-11-29 ENCOUNTER — Encounter: Payer: BC Managed Care – PPO | Admitting: Certified Nurse Midwife

## 2020-11-29 LAB — CBC
HCT: 30.6 % — ABNORMAL LOW (ref 36.0–46.0)
Hemoglobin: 10.3 g/dL — ABNORMAL LOW (ref 12.0–15.0)
MCH: 30.7 pg (ref 26.0–34.0)
MCHC: 33.7 g/dL (ref 30.0–36.0)
MCV: 91.3 fL (ref 80.0–100.0)
Platelets: 249 10*3/uL (ref 150–400)
RBC: 3.35 MIL/uL — ABNORMAL LOW (ref 3.87–5.11)
RDW: 14.4 % (ref 11.5–15.5)
WBC: 13.9 10*3/uL — ABNORMAL HIGH (ref 4.0–10.5)
nRBC: 0 % (ref 0.0–0.2)

## 2020-11-29 LAB — TYPE AND SCREEN
ABO/RH(D): O NEG
Antibody Screen: POSITIVE

## 2020-11-29 LAB — FETAL SCREEN: Fetal Screen: NEGATIVE

## 2020-11-29 MED ORDER — RHO D IMMUNE GLOBULIN 1500 UNIT/2ML IJ SOSY
300.0000 ug | PREFILLED_SYRINGE | Freq: Once | INTRAMUSCULAR | Status: AC
  Administered 2020-11-29: 300 ug via INTRAMUSCULAR
  Filled 2020-11-29: qty 2

## 2020-11-29 MED ORDER — IBUPROFEN 600 MG PO TABS: 600.0000 mg | ORAL_TABLET | Freq: Four times a day (QID) | ORAL | 0 refills | Status: DC

## 2020-11-29 MED ORDER — ACETAMINOPHEN 500 MG PO TABS: 1000.0000 mg | ORAL_TABLET | Freq: Four times a day (QID) | ORAL | 0 refills | Status: DC | PRN

## 2020-11-29 NOTE — Discharge Summary (Signed)
Physician Obstetric Discharge Summary  Patient ID: Elizabeth Harding MRN: [id number] DOB/AGE: [birth year] 34 y.o.   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  Patient had an uncomplicated postpartum course.  By time of discharge on PPD#1, her pain was controlled on oral pain medications; she had appropriate lochia and was ambulating, voiding without difficulty and tolerating regular diet.  She was deemed stable for discharge to home.     Labs: CBC Latest Ref Rng & Units 11/29/2020 11/27/2020 09/07/2020  WBC 4.0 - 10.5 K/uL 13.9(H) 10.0 10.6  Hemoglobin 12.0 - 15.0 g/dL 10.3(L) 10.8(L) 11.6  Hematocrit 36.0 - 46.0 % 30.6(L) 31.6(L) 33.9(L)  Platelets 150 - 400 K/uL 249 254 273   O NEG Performed at [facility], [address] [place], [place] [address]   Physical exam:   Temp:  [97.8 F (36.6 C)-99.3 F (37.4 C)] 98.6 F (37 C) (12/23 0757) Pulse Rate:  [60-109] 68 (12/23 0757) Resp:  [16-18] 18 (12/23 0757) BP: (98-120)/(53-76) 98/60 (12/23 0757) SpO2:  [97 %-100 %] 97 % (12/23 0757)  General: alert and no distress  Lochia:  appropriate  Abdomen: soft, NT  Uterine Fundus: firm  Perineum: healing well, no significant drainage, no dehiscence, no significant erythema  Extremities: No evidence of DVT seen on physical exam. No lower extremity edema.  Edinburgh Postnatal Depression Scale Screening Tool 08/20/2017  I have been able to laugh and see the funny side of things. 0  I have looked forward with enjoyment to things. 0  I have blamed myself unnecessarily when things went wrong. 0  I have been anxious or worried for no good reason. 1  I have felt scared or panicky for no good reason. 1  Things have been getting on top of me. 0  I have been so unhappy that I have had difficulty sleeping. 0  I have felt sad or miserable. 0  I have been so unhappy that I have been crying. 0  The thought of harming myself has occurred to me. 0  Edinburgh Postnatal Depression Scale Total 2     Discharge Instructions: Per After Visit Summary.  Activity: Advance as tolerated. Pelvic rest for 6 weeks.  Also refer to After Visit Summary  Diet: Regular  Medications: Allergies as of 11/29/2020      Reactions   Sunscreens Rash      Medication List    TAKE these medications   acetaminophen 500 MG tablet Commonly known as: TYLENOL Take 2 tablets (1,000 mg total) by mouth every 6 (six) hours as needed for mild pain or moderate pain.   albuterol 108 (90 Base) MCG/ACT inhaler Commonly known as: VENTOLIN HFA  Inhale 2 puffs into the lungs every 6 (six) hours as needed for wheezing or shortness of breath.   Guaifenesin 1200 MG Tb12   ibuprofen 600 MG tablet Commonly known as: ADVIL Take 1 tablet (600 mg total) by mouth every 6 (six) hours.   LACTAID FAST ACT PO Take 1 tablet by mouth daily as needed (lactose intolerance).   pantoprazole 20 MG tablet Commonly known as: Protonix Take 1 tablet (20 mg total) by mouth 2 (two) times daily.   prenatal multivitamin Tabs tablet Take 1 tablet by mouth at bedtime.    PROBIOTIC GOLD EXTRA STRENGTH PO Take by mouth.      Outpatient follow up:   Follow-up Information    Gunnar Bulla, CNM Follow up.   Specialties: Certified Nurse Midwife, Obstetrics and Gynecology, Radiology Why: Someone from the office will call you to schedule a two (2) week TELEVISIT and six (6) week POSTPARTUM VISIT Contact information: [address] [place] [address] [phone]              Postpartum contraception: Will discuss further at postpartum visit  Discharged Condition: stable  Discharged to: home   Newborn Data:  Disposition:home with mother  Apgars: APGAR (1 MIN): 5   APGAR (5 MINS): 9    Baby Feeding: Breast    [name], CNM  [facility], [facility] 11/29/20 1:02 PM

## 2020-11-29 NOTE — Lactation Note (Signed)
This note was copied from a baby's chart. Lactation Consultation Note  Patient Name: Elizabeth Harding [id number] Date: 11/29/2020 Reason for consult: Follow-up assessment;Term;Nipple pain/trauma  RN was about to visualize the latch w/ shield when LC was unavailable. RN reported to [facility] that mom felt the same amount of discomfort with using the shield. She declines shield use,and will continue to feed without it and will follow-up on possible oral restriction when she visits the Peds office at the baby's follow-up appointment.  LC spoke with mom, reviewed nipple care: use of coconut oil and comfort gels, and expressed colostrum to prevent bacterial growth, and promote healing. Encouraged to feed on demand, breast massage/compression with feeding, warmth if needed. Reviewed breast fullness/engorgement and management of both, and discouraged the early use of bottles and pacifiers until breastfeeding well established (week 3-4).  Information given for outpatient lactation services and community breastfeeding support. No questions/concerns voiced.  Maternal Data Formula Feeding for Exclusion: No Has patient been taught Hand Expression?: Yes Does the patient have breastfeeding experience prior to this delivery?: Yes  Feeding Feeding Type: Breast Fed  LATCH Score                   Interventions Interventions: Breast feeding basics reviewed  Lactation Tools Discussed/Used Tools: Nipple Shields Nipple shield size: 20   Consult Status Consult Status: Follow-up Date: 11/29/20 Follow-up type: In-patient    Danford Bad 11/29/2020, 12:43 PM

## 2020-11-29 NOTE — Progress Notes (Signed)
Pt discharged with infant.  Discharge instructions, prescriptions and follow up appointment given to and reviewed with pt. Pt verbalized understanding. Escorted out by auxillary. 

## 2020-11-29 NOTE — Discharge Instructions (Signed)
Breastfeeding and Tongue Tie  Breastfeeding can be challenging, especially during the first few weeks after childbirth. It is normal to have some problems when you start to breastfeed your new baby, even if you have breastfed before. Tongue tie is a condition that some babies are born with (congenital). With this condition, a baby's tongue cannot move freely in his or her mouth because the band of tissue that connects the tongue to the floor of the mouth (frenulum) is too short or too tight. Having a heart-shaped tongue can be a sign that an infant has tongue tie. In some cases, tongue tie can interfere with breastfeeding. How does this affect me? If your baby has tongue tie, he or she may have trouble latching on to your nipple properly for breastfeeding. As a result, you may have problems such as:  Creased, flat, or white (blanched) nipples after feedings.  Cracked or sore nipples.  Discomfort while feeding.  Plugged milk ducts.  Breasts becoming overfilled with milk (engorgement).  Low milk supply.  Breast inflammation or infection.  Sleep deprivation. This can occur because the baby is not able to nurse as efficiently, so the mother often compensates by nursing more often, including at night. How does this affect my baby? Many babies with tongue tie can breastfeed successfully without problems. If your baby's condition causes problems, the symptoms may include:  Difficulty latching onto the nipple.  Gumming or chewing the nipple while nursing.  Inability to fully drain the breast.  Gassiness. Babies with tongue tie often swallow a lot of air because they cannot maintain suction properly.  Poor weight gain.  Excessive drooling.  Choking on milk or coming off the breast to gasp for air while feeding.  Falling asleep during feedings, then waking a short time later to nurse again.  Sleep deprivation. This is due to the need for frequent feedings.  Long feeding  times.  Inability to hold a pacifier or bottle.  Clicking noises while sucking.  Coming on and off the breast often. How is this treated? If tongue tie causes your baby to have problems breastfeeding, you should work with a breastfeeding specialist (Advertising copywriter) to find positions and strategies that will help make sure your baby has a good latch. Many babies grow out of the condition as they get older. If needed, a small cut can be made on the frenulum (frenotomy). This procedure will allow the tongue to move freely. Follow these instructions at home: Work with a Advertising copywriter to make sure that your baby is able to feed well. This may include:  Finding breastfeeding positions that work well for you and your baby.  Taking steps to help your baby latch. To help your baby latch, follow these steps: 1. Stroke your baby's lips gently with your finger or nipple. 2. When your baby's mouth is open wide enough, quickly bring your baby to your breast and place your entire nipple, and as much of the areolaas possible, into your baby's mouth. The areola is the colored area around your nipple. 3. Your baby's tongue should be between his or her lower gum and your breast. 4. More areola should be visible above your baby's upper lip than below the lower lip. 5. When your baby starts sucking, you will feel a gentle pull on your nipple, but you should not feel pain. Be patient. It is common for a baby to suck for about 2-3 minutes to start the flow of breast milk. 6. Make sure that  your baby's mouth is correctly positioned around your nipple. Your baby's lips should create a seal on your breast and be turned outward (everted).  Look for the following signs that your baby has successfully latched on to your nipple: ? The baby is quietly tugging or quietly sucking without causing you pain. ? You hear the baby swallow after every 3 or 4 sucks. ? You see muscle movement above and in front of the  baby's ears while he or she is sucking.  Be aware of these signs that your baby has not successfully latched on to your nipple: ? The baby makes sucking, clicking, or smacking sounds while nursing. ? You have nipple pain.  If your baby is not latched well, insert your little finger between your baby's gums and your nipple to break the seal. Then try to help your baby latch again. Contact a health care provider if:  Your baby is not gaining weight or loses weight.  You have cracking or soreness in your nipples that lasts longer than 1 week.  You have nipple pain. Nipple cracking and soreness are common during the first week after birth, but nipple pain is never normal.  You have breast engorgement that does not improve after 48-72 hours.  You have a plugged milk duct and a fever.  You follow suggestions for a good latch, but you continue to have problems or concerns.  You have pus-like discharge coming from your breast. Get help right away if:  Your baby is not wetting or soiling diapers as often as your health care provider told you to expect. Summary  Tongue tie is a condition in which a baby's tongue cannot move freely in his or her mouth because the band of tissue that connects the tongue to the floor of the mouth (frenulum) is too short or too tight.  If a baby has tongue tie, breastfeeding may be painful for the mother, and the baby may have trouble getting enough breast milk. This may lead to slow weight gain.  Many babies with tongue tie can breastfeed successfully without problems. Get help from a lactation specialist early on to prevent problems from occurring. This information is not intended to replace advice given to you by your health care provider. Make sure you discuss any questions you have with your health care provider. Document Revised: 03/16/2019 Document Reviewed: 09/01/2017 Elsevier Patient Education  2020 [employer]   Postpartum Care After Vaginal  Delivery This sheet gives you information about how to care for yourself from the time you deliver your baby to up to 6-12 weeks after delivery (postpartum period). Your health care provider may also give you more specific instructions. If you have problems or questions, contact your health care provider. Follow these instructions at home: Vaginal bleeding  It is normal to have vaginal bleeding (lochia) after delivery. Wear a sanitary pad for vaginal bleeding and discharge. ? During the first week after delivery, the amount and appearance of lochia is often similar to a menstrual period. ? Over the next few weeks, it will gradually decrease to a dry, yellow-brown discharge. ? For most women, lochia stops completely by 4-6 weeks after delivery. Vaginal bleeding can vary from woman to woman.  Change your sanitary pads frequently. Watch for any changes in your flow, such as: ? A sudden increase in volume. ? A change in color. ? Large blood clots.  If you pass a blood clot from your vagina, save it and call your health care  provider to discuss. Do not flush blood clots down the toilet before talking with your health care provider.  Do not use tampons or douches until your health care provider says this is safe.  If you are not breastfeeding, your period should return 6-8 weeks after delivery. If you are feeding your child breast milk only (exclusive breastfeeding), your period may not return until you stop breastfeeding. Perineal care  Keep the area between the vagina and the anus (perineum) clean and dry as told by your health care provider. Use medicated pads and pain-relieving sprays and creams as directed.  If you had a cut in the perineum (episiotomy) or a tear in the vagina, check the area for signs of infection until you are healed. Check for: ? More redness, swelling, or pain. ? Fluid or blood coming from the cut or tear. ? Warmth. ? Pus or a bad smell.  You may be given a squirt  bottle to use instead of wiping to clean the perineum area after you go to the bathroom. As you start healing, you may use the squirt bottle before wiping yourself. Make sure to wipe gently.  To relieve pain caused by an episiotomy, a tear in the vagina, or swollen veins in the anus (hemorrhoids), try taking a warm sitz bath 2-3 times a day. A sitz bath is a warm water bath that is taken while you are sitting down. The water should only come up to your hips and should cover your buttocks. Breast care  Within the first few days after delivery, your breasts may feel heavy, full, and uncomfortable (breast engorgement). Milk may also leak from your breasts. Your health care provider can suggest ways to help relieve the discomfort. Breast engorgement should go away within a few days.  If you are breastfeeding: ? Wear a bra that supports your breasts and fits you well. ? Keep your nipples clean and dry. Apply creams and ointments as told by your health care provider. ? You may need to use breast pads to absorb milk that leaks from your breasts. ? You may have uterine contractions every time you breastfeed for up to several weeks after delivery. Uterine contractions help your uterus return to its normal size. ? If you have any problems with breastfeeding, work with your health care provider or Advertising copywriter.  If you are not breastfeeding: ? Avoid touching your breasts a lot. Doing this can make your breasts produce more milk. ? Wear a good-fitting bra and use cold packs to help with swelling. ? Do not squeeze out (express) milk. This causes you to make more milk. Intimacy and sexuality  Ask your health care provider when you can engage in sexual activity. This may depend on: ? Your risk of infection. ? How fast you are healing. ? Your comfort and desire to engage in sexual activity.  You are able to get pregnant after delivery, even if you have not had your period. If desired, talk with your  health care provider about methods of birth control (contraception). Medicines  Take over-the-counter and prescription medicines only as told by your health care provider.  If you were prescribed an antibiotic medicine, take it as told by your health care provider. Do not stop taking the antibiotic even if you start to feel better. Activity  Gradually return to your normal activities as told by your health care provider. Ask your health care provider what activities are safe for you.  Rest as much as possible. Try  to rest or take a nap while your baby is sleeping. Eating and drinking   Drink enough fluid to keep your urine pale yellow.  Eat high-fiber foods every day. These may help prevent or relieve constipation. High-fiber foods include: ? Whole grain cereals and breads. ? Brown rice. ? Beans. ? Fresh fruits and vegetables.  Do not try to lose weight quickly by cutting back on calories.  Take your prenatal vitamins until your postpartum checkup or until your health care provider tells you it is okay to stop. Lifestyle  Do not use any products that contain nicotine or tobacco, such as cigarettes and e-cigarettes. If you need help quitting, ask your health care provider.  Do not drink alcohol, especially if you are breastfeeding. General instructions  Keep all follow-up visits for you and your baby as told by your health care provider. Most women visit their health care provider for a postpartum checkup within the first 3-6 weeks after delivery. Contact a health care provider if:  You feel unable to cope with the changes that your child brings to your life, and these feelings do not go away.  You feel unusually sad or worried.  Your breasts become red, painful, or hard.  You have a fever.  You have trouble holding urine or keeping urine from leaking.  You have little or no interest in activities you used to enjoy.  You have not breastfed at all and you have not had a  menstrual period for 12 weeks after delivery.  You have stopped breastfeeding and you have not had a menstrual period for 12 weeks after you stopped breastfeeding.  You have questions about caring for yourself or your baby.  You pass a blood clot from your vagina. Get help right away if:  You have chest pain.  You have difficulty breathing.  You have sudden, severe leg pain.  You have severe pain or cramping in your lower abdomen.  You bleed from your vagina so much that you fill more than one sanitary pad in one hour. Bleeding should not be heavier than your heaviest period.  You develop a severe headache.  You faint.  You have blurred vision or spots in your vision.  You have bad-smelling vaginal discharge.  You have thoughts about hurting yourself or your baby. If you ever feel like you may hurt yourself or others, or have thoughts about taking your own life, get help right away. You can go to the nearest emergency department or call:  Your local emergency services (911 in the [place]  A suicide crisis helpline, such as the National Suicide Prevention Lifeline at 220-431-0957. This is open 24 hours a day. Summary  The period of time right after you deliver your newborn up to 6-12 weeks after delivery is called the postpartum period.  Gradually return to your normal activities as told by your health care provider.  Keep all follow-up visits for you and your baby as told by your health care provider. This information is not intended to replace advice given to you by your health care provider. Make sure you discuss any questions you have with your health care provider. Document Revised: 11/27/2017 Document Reviewed: 09/07/2017 Elsevier Patient Education  2020 [employer].   Postpartum Baby Blues The postpartum period begins right after the birth of a baby. During this time, there is often a lot of joy and excitement. It is also a time of many changes in the life of  the parents. No matter  how many times a mother gives birth, each child brings new challenges to the family, including different ways of relating to one another. It is common to have feelings of excitement along with confusing changes in moods, emotions, and thoughts. You may feel happy one minute and sad or stressed the next. These feelings of sadness usually happen in the period right after you have your baby, and they go away within a week or two. This is called the "baby blues." What are the causes? There is no known cause of baby blues. It is likely caused by a combination of factors. However, changes in hormone levels after childbirth are believed to trigger some of the symptoms. Other factors that can play a role in these mood changes include:  Lack of sleep.  Stressful life events, such as poverty, caring for a loved one, or death of a loved one.  Genetics. What are the signs or symptoms? Symptoms of this condition include:  Brief changes in mood, such as going from extreme happiness to sadness.  Decreased concentration.  Difficulty sleeping.  Crying spells and tearfulness.  Loss of appetite.  Irritability.  Anxiety. If the symptoms of baby blues last for more than 2 weeks or become more severe, you may have postpartum depression. How is this diagnosed? This condition is diagnosed based on an evaluation of your symptoms. There are no medical or lab tests that lead to a diagnosis, but there are various questionnaires that a health care provider may use to identify women with the baby blues or postpartum depression. How is this treated? Treatment is not needed for this condition. The baby blues usually go away on their own in 1-2 weeks. Social support is often all that is needed. You will be encouraged to get adequate sleep and rest. Follow these instructions at home: Lifestyle      Get as much rest as you can. Take a nap when the baby sleeps.  Exercise regularly as told  by your health care provider. Some women find yoga and walking to be helpful.  Eat a balanced and nourishing diet. This includes plenty of fruits and vegetables, whole grains, and lean proteins.  Do little things that you enjoy. Have a cup of tea, take a bubble bath, read your favorite magazine, or listen to your favorite music.  Avoid alcohol.  Ask for help with household chores, cooking, grocery shopping, or running errands. Do not try to do everything yourself. Consider hiring a postpartum doula to help. This is a professional who specializes in providing support to new mothers.  Try not to make any major life changes during pregnancy or right after giving birth. This can add stress. General instructions  Talk to people close to you about how you are feeling. Get support from your partner, family members, friends, or other new moms. You may want to join a support group.  Find ways to cope with stress. This may include: ? Writing your thoughts and feelings in a journal. ? Spending time outside. ? Spending time with people who make you laugh.  Try to stay positive in how you think. Think about the things you are grateful for.  Take over-the-counter and prescription medicines only as told by your health care provider.  Let your health care provider know if you have any concerns.  Keep all postpartum visits as told by your health care provider. This is important. Contact a health care provider if:  Your baby blues do not go away after 2  weeks. Get help right away if:  You have thoughts of taking your own life (suicidal thoughts).  You think you may harm the baby or other people.  You see or hear things that are not there (hallucinations). Summary  After giving birth, you may feel happy one minute and sad or stressed the next. Feelings of sadness that happen right after the baby is born and go away after a week or two are called the "baby blues."  You can manage the baby blues  by getting enough rest, eating a healthy diet, exercising, spending time with supportive people, and finding ways to cope with stress.  If feelings of sadness and stress last longer than 2 weeks or get in the way of caring for your baby, talk to your health care provider. This may mean you have postpartum depression. This information is not intended to replace advice given to you by your health care provider. Make sure you discuss any questions you have with your health care provider. Document Revised: 03/18/2019 Document Reviewed: 01/20/2017 Elsevier Patient Education  2020 [employer]

## 2020-11-29 NOTE — Lactation Note (Signed)
This note was copied from a baby's chart. Lactation Consultation Note  Patient Name: Girl Deni Berti [id number] Date: 11/29/2020 Reason for consult: Follow-up assessment;Term;Nipple pain/trauma  Lactation follow-up. Mom reporting pain/discomfort while nursing, but knows baby is transferring well. Peds took a look this am, possible restriction- to be followed up in Peds office at discharge appointment. Mom reports using nipple shield with second until correction was done; size 20 mm NS given, and LC educated and demonstrated placement, reviewed cleaning instructions. Mom was able to teach back placement of nipple shield and cleaning instructions. RN updated. LC to follow-up at next feeding.  Maternal Data Formula Feeding for Exclusion: No Has patient been taught Hand Expression?: Yes Does the patient have breastfeeding experience prior to this delivery?: Yes  Feeding    LATCH Score                   Interventions Interventions: Breast feeding basics reviewed  Lactation Tools Discussed/Used Tools: Nipple Shields Nipple shield size: 20   Consult Status Consult Status: Follow-up Date: 11/29/20 Follow-up type: In-patient    Danford Bad 11/29/2020, 10:54 AM

## 2020-11-30 LAB — RHOGAM INJECTION: Unit division: 0

## 2020-12-12 ENCOUNTER — Other Ambulatory Visit: Payer: Self-pay | Admitting: Certified Nurse Midwife

## 2020-12-13 ENCOUNTER — Encounter: Payer: BC Managed Care – PPO | Admitting: Certified Nurse Midwife

## 2020-12-13 ENCOUNTER — Encounter: Payer: Self-pay | Admitting: Certified Nurse Midwife

## 2020-12-20 ENCOUNTER — Other Ambulatory Visit: Payer: Self-pay

## 2020-12-20 ENCOUNTER — Ambulatory Visit (INDEPENDENT_AMBULATORY_CARE_PROVIDER_SITE_OTHER): Payer: BC Managed Care – PPO | Admitting: Certified Nurse Midwife

## 2020-12-20 DIAGNOSIS — Z1331 Encounter for screening for depression: Secondary | ICD-10-CM

## 2020-12-20 DIAGNOSIS — K5900 Constipation, unspecified: Secondary | ICD-10-CM

## 2020-12-20 DIAGNOSIS — Z8659 Personal history of other mental and behavioral disorders: Secondary | ICD-10-CM

## 2020-12-20 NOTE — Progress Notes (Signed)
Virtual Visit via Telephone Note  I connected with Genni Buske on 12/20/20 at 1021 by telephone and verified that I am speaking with the correct person using two identifiers.  Location:  Patient: [place] [facility] (home)  Provider: [name], CNM ([facility], [facility])   I discussed the limitations, risks, security and privacy concerns of performing an evaluation and management service by telephone and the availability of in person appointments. I also discussed with the patient that there may be a patient responsible charge related to this service. The patient expressed understanding and agreed to proceed.   History of Present Illness:  Patient is three (3) weeks postpartum spontaneous vaginal birth of female infant.   Doing well overall except older children are currently home due to daycare closure after COVID exposure.   Breastfeeding is going well after revision of infant oral tie, following up with lactation consult as needed for oversupply of milk.   Bleeding is light. Constipation unrelieved with colace.   Notes short temper and increased frustration. History significant for anxiety managed with Wellburtin after last pregnancy.   Observations/Objective:  Depression screen PHQ 2/9 12/20/2020  Decreased Interest 1  Down, Depressed, Hopeless 1  PHQ - 2 Score 2  Altered sleeping 0  Tired, decreased energy 2  Change in appetite 1  Feeling bad or failure about yourself  1  Trouble concentrating 0  Moving slowly or fidgety/restless 0  Suicidal thoughts 0  PHQ-9 Score 6  Difficult doing work/chores Somewhat difficult   GAD 7 : Generalized Anxiety Score 12/20/2020  Nervous, Anxious, on Edge 1  Control/stop worrying 1  Worry too much - different things 0  Trouble relaxing 2  Restless 1  Easily annoyed or irritable 3  Afraid - awful might happen 1  Total GAD 7 Score 9  Anxiety Difficulty Very difficult    Assessment:  Postpartum care and  examination Lactation mother Depression screen negative  History of anxiety Constipation  Plan:  Discussed home treatment measure for constipation, daily Benefiber with Colace.   Encouraged patient to contact CNM when she felt like Wellbutrin was needed.   Reviewed red flag symptoms and when to call.   RTC for PPV scheduled on 01/11/2021 at 0845 or sooner if needed.     Follow Up Instructions:    I discussed the assessment and treatment plan with the patient. The patient was provided an opportunity to ask questions and all were answered. The patient agreed with the plan and demonstrated an understanding of the instructions.   The patient was advised to call back or seek an in-person evaluation if the symptoms worsen or if the condition fails to improve as anticipated.  I provided 8 minutes of non-face-to-face time during this encounter.   [name], CNM  [facility], [facility]  12/20/20 12:35 PM

## 2020-12-20 NOTE — Patient Instructions (Signed)
Perinatal Mood and Anxiety Disorder Perinatal mood and anxiety disorder (PMAD) is a mental health condition that happens when a person feels excessive sadness, anger, or worry and tension (anxiety) during pregnancy or during the first few months after the birth. This condition can last a few months or may continue for years if left untreated. PMAD may cause serious problems for the mother, her baby, or the father if not properly managed. Depression and anxiety can interfere with the ability to take care of the baby. It also may affect work, school, relationships, and other everyday activities. Having the baby blues is considered normal. Mild to moderate levels of sadness, exhaustion, and generally struggling with being a parent are considered the blues. Many parents experience these during the first 1-2 weeks after giving birth. If these symptoms become worse or last too long, it may be PMAD. What are the causes? The exact cause of this condition is not known. It may result from a combination of hormone changes and biological, social, and psychological factors. What increases the risk? The following factors may make you more likely to develop this condition:  Having a personal or family history of depression, anxiety, or mood disorders.  Experiencing a stressful life event during pregnancy, such as the death of a loved one.  Having additional life stress, such as being a single parent.  Having thyroid problems. What are the signs or symptoms? Symptoms of this condition include:  Physical symptoms, such as: ? Panic attacks. These are intense episodes of fear or discomfort that may also cause sweating, nausea, shortness of breath, or fear of dying. They usually last 5-15 minutes but can last longer. ? Performing repetitive tasks to relieve stress or worry (obsessive compulsive disorder, or OCD). ? Problems with appetite or sleep.  Emotional symptoms, such as: ? Excessive worry about problems or  feeling like something bad will happen (generalized anxiety disorder). ? Phobias, which are fears of certain objects or situations. ? Separation anxiety, or fear and stress about leaving certain people or loved ones.  Behavioral symptoms, such as: ? Depression, or lack of motivation and energy. ? Intense mood swings involving emotional highs and lows. ? Feeling out of control or like you are going crazy. ? Having difficulty bonding with your baby. Some people also have trouble relaxing, problems concentrating, problems sleeping, frequent nightmares, and disturbing thoughts. PMAD can be different for everyone and can affect men as well as women. How is this diagnosed? This condition is diagnosed based on a physical exam and mental evaluation.  In some cases, your health care provider may use an anxiety or depression screening tool. This includes a list of questions that can help a health care provider diagnose PMAD.  You may be referred to a mental health expert who specializes in treating PMAD. How is this treated? This condition may be treated with:  Talk therapy with a mental health professional. This may be family therapy, marriage therapy, cognitive behavioral therapy, or interpersonal therapy.  Medicines. Your health care provider will discuss medicines that are safe to use during pregnancy and breastfeeding.  Stress reduction therapies, such as mindfulness, deep breathing, or guided muscle relaxation.  Support groups, early childhood education, or other groups to help with being a parent.   Follow these instructions at home: Lifestyle  Do not use any products that contain nicotine or tobacco. These products include cigarettes, chewing tobacco, and vaping devices, such as e-cigarettes. If you need help quitting, ask your health care provider.  Do not drink alcohol when you are pregnant. It is also safest not to drink alcohol if you are breastfeeding. ? After your baby is born, if  you drink alcohol:  Limit how much you have to 0-1 drink a day.  Be aware of how much alcohol is in your drink. In the [place] one drink equals one 12 oz bottle of beer (355 mL), one 5 oz glass of wine (148 mL), or one 1 oz glass of hard liquor (44 mL).  Consider joining a support group for new mothers. Ask your health care provider for recommendations.  Take good care of yourself. Make sure you: ? Get as much rest as possible. Talk with your partner about sharing the responsibility of getting up with your baby if possible. Make sleep a priority. ? Eat a healthy diet. This includes plenty of fruits and vegetables, whole grains, and lean proteins. ? Exercise regularly, as told by your health care provider. Ask your health care provider what exercises are safe for you. Talk with your partner about making sure you both have opportunities to exercise. General instructions  Take over-the-counter and prescription medicines only as told by your health care provider.  Talk with your partner or family members about your feelings during pregnancy. Share your concerns, needs, or anxieties with each other. Do not be afraid to ask for help. Find a mental health professional, if needed.  Ask for help with tasks or chores when you need it. Ask friends and family members to provide meals, watch your children, or help with cleaning. If friends or family are not able to help, consider finding a licensed child care provider or professional house cleaner if needed. Let your partner know what you need. He or she may be struggling too.  Keep all follow-up visits. This is important. Contact a health care provider if:  You or people close to you notice that you have symptoms of anxiety or depression.  Your symptoms of anxiety or depression get worse.  You take medicines and have side effects that are uncomfortable or difficult to tolerate. Get help right away if:  You feel like hurting yourself, your baby, or  someone else. If you feel like you may hurt yourself or others, or have thoughts about taking your own life, get help right away. Go to your nearest emergency department or:  Call your local emergency services (911 in the [place]  Call a suicide crisis helpline, such as the [facility] [facility] at 210-075-2574. This is open 24 hours a day in the [place]  Text the Crisis Text Line at (386)398-8752 (in the [place].). Summary  Perinatal mood and anxiety disorder (PMAD) is when a woman or her partner feels excessive sadness, anger, or worry and tension (anxiety) during pregnancy or during the first few months after the birth.  PMAD may include depression, intense mood swings, panic attacks, separation anxiety, phobias, or generalized anxiety.  PMAD can cause problems for the mother, the baby, or the father if not properly managed.  This condition is treated with medicines, talk therapy, stress reduction therapies, or a combination of treatments.  Talk with your partner or family members about your concerns or fears. Ask for help when you need it. This information is not intended to replace advice given to you by your health care provider. Make sure you discuss any questions you have with your health care provider. Document Revised: 05/18/2020 Document Reviewed: 05/18/2020 Elsevier Patient Education  2021 [employer].   Bupropion  extended-release tablets (Depression/Mood Disorders) What is this medicine? BUPROPION (byoo PROE pee on) is used to treat depression. This medicine may be used for other purposes; ask your health care provider or pharmacist if you have questions. COMMON BRAND NAME(S): Aplenzin, Budeprion XL, Forfivo XL, Wellbutrin XL What should I tell my health care provider before I take this medicine? They need to know if you have any of these conditions:  an eating disorder, such as anorexia or bulimia  bipolar disorder or psychosis  diabetes or high blood sugar,  treated with medication  glaucoma  head injury or brain tumor  heart disease, previous heart attack, or irregular heart beat  high blood pressure  kidney or liver disease  seizures (convulsions)  suicidal thoughts or a previous suicide attempt  Tourette's syndrome  weight loss  an unusual or allergic reaction to bupropion, other medicines, foods, dyes, or preservatives  breast-feeding  pregnant or trying to become pregnant How should I use this medicine? Take this medicine by mouth with a glass of water. Follow the directions on the prescription label. You can take it with or without food. If it upsets your stomach, take it with food. Do not crush, chew, or cut these tablets. This medicine is taken once daily at the same time each day. Do not take your medicine more often than directed. Do not stop taking this medicine suddenly except upon the advice of your doctor. Stopping this medicine too quickly may cause serious side effects or your condition may worsen. A special MedGuide will be given to you by the pharmacist with each prescription and refill. Be sure to read this information carefully each time. Talk to your pediatrician regarding the use of this medicine in children. Special care may be needed. Overdosage: If you think you have taken too much of this medicine contact a poison control center or emergency room at once. NOTE: This medicine is only for you. Do not share this medicine with others. What if I miss a dose? If you miss a dose, skip the missed dose and take your next tablet at the regular time. Do not take double or extra doses. What may interact with this medicine? Do not take this medicine with any of the following medications:  linezolid  MAOIs like Azilect, Carbex, Eldepryl, Marplan, Nardil, and Parnate  methylene blue (injected into a vein)  other medicines that contain bupropion like Zyban This medicine may also interact with the following  medications:  alcohol  certain medicines for anxiety or sleep  certain medicines for blood pressure like metoprolol, propranolol  certain medicines for depression or psychotic disturbances  certain medicines for HIV or AIDS like efavirenz, lopinavir, nelfinavir, ritonavir  certain medicines for irregular heart beat like propafenone, flecainide  certain medicines for Parkinson's disease like amantadine, levodopa  certain medicines for seizures like carbamazepine, phenytoin, phenobarbital  cimetidine  clopidogrel  cyclophosphamide  digoxin  furazolidone  isoniazid  nicotine  orphenadrine  procarbazine  steroid medicines like prednisone or cortisone  stimulant medicines for attention disorders, weight loss, or to stay awake  tamoxifen  theophylline  thiotepa  ticlopidine  tramadol  warfarin This list may not describe all possible interactions. Give your health care provider a list of all the medicines, herbs, non-prescription drugs, or dietary supplements you use. Also tell them if you smoke, drink alcohol, or use illegal drugs. Some items may interact with your medicine. What should I watch for while using this medicine? Tell your doctor if your symptoms do  not get better or if they get worse. Visit your doctor or healthcare provider for regular checks on your progress. Because it may take several weeks to see the full effects of this medicine, it is important to continue your treatment as prescribed by your doctor. This medicine may cause serious skin reactions. They can happen weeks to months after starting the medicine. Contact your healthcare provider right away if you notice fevers or flu-like symptoms with a rash. The rash may be red or purple and then turn into blisters or peeling of the skin. Or, you might notice a red rash with swelling of the face, lips or lymph nodes in your neck or under your arms. Patients and their families should watch out for new  or worsening thoughts of suicide or depression. Also watch out for sudden changes in feelings such as feeling anxious, agitated, panicky, irritable, hostile, aggressive, impulsive, severely restless, overly excited and hyperactive, or not being able to sleep. If this happens, especially at the beginning of treatment or after a change in dose, call your healthcare provider. Avoid alcoholic drinks while taking this medicine. Drinking large amounts of alcoholic beverages, using sleeping or anxiety medicines, or quickly stopping the use of these agents while taking this medicine may increase your risk for a seizure. Do not drive or use heavy machinery until you know how this medicine affects you. This medicine can impair your ability to perform these tasks. Do not take this medicine close to bedtime. It may prevent you from sleeping. Your mouth may get dry. Chewing sugarless gum or sucking hard candy, and drinking plenty of water may help. Contact your doctor if the problem does not go away or is severe. The tablet shell for some brands of this medicine does not dissolve. This is normal. The tablet shell may appear whole in the stool. This is not a cause for concern. What side effects may I notice from receiving this medicine? Side effects that you should report to your doctor or health care professional as soon as possible:  allergic reactions like skin rash, itching or hives, swelling of the face, lips, or tongue  breathing problems  changes in vision  confusion  elevated mood, decreased need for sleep, racing thoughts, impulsive behavior  fast or irregular heartbeat  hallucinations, loss of contact with reality  increased blood pressure  rash, fever, and swollen lymph nodes  redness, blistering, peeling or loosening of the skin, including inside the mouth  seizures  suicidal thoughts or other mood changes  unusually weak or tired  vomiting Side effects that usually do not require  medical attention (report to your doctor or health care professional if they continue or are bothersome):  constipation  headache  loss of appetite  nausea  tremors  weight loss This list may not describe all possible side effects. Call your doctor for medical advice about side effects. You may report side effects to FDA at 1-800-FDA-1088. Where should I keep my medicine? Keep out of the reach of children. Store at room temperature between 15 and 30 degrees C (59 and 86 degrees F). Throw away any unused medicine after the expiration date. NOTE: This sheet is a summary. It may not cover all possible information. If you have questions about this medicine, talk to your doctor, pharmacist, or health care provider.  2021 Elsevier/Gold Standard (2019-02-17 13:45:31)   Common Medications Safe in Pregnancy  Acne:      Constipation:  Benzoyl Peroxide     Colace  Clindamycin      Dulcolax Suppository  Topica Erythromycin     Fibercon  Salicylic Acid      Metamucil         Miralax AVOID:        Senakot   Accutane    Cough:  Retin-A       Cough Drops  Tetracycline      Phenergan w/ Codeine if Rx  Minocycline      Robitussin (Plain & DM)  Antibiotics:     Crabs/Lice:  Ceclor       RID  Cephalosporins    AVOID:  E-Mycins      Kwell  Keflex  Macrobid/Macrodantin   Diarrhea:  Penicillin      Kao-Pectate  Zithromax      Imodium AD         PUSH FLUIDS AVOID:       Cipro     Fever:  Tetracycline      Tylenol (Regular or Extra  Minocycline       Strength)  Levaquin      Extra Strength-Do not          Exceed 8 tabs/24 hrs Caffeine:        <260m/day (equiv. To 1 cup of coffee or  approx. 3 12 oz sodas)         Gas: Cold/Hayfever:       Gas-X  Benadryl      Mylicon  Claritin       Phazyme  **Claritin-D        Chlor-Trimeton    Headaches:  Dimetapp      ASA-Free Excedrin  Drixoral-Non-Drowsy     Cold Compress  Mucinex (Guaifenasin)     Tylenol (Regular or  Extra  Sudafed/Sudafed-12 Hour     Strength)  **Sudafed PE Pseudoephedrine   Tylenol Cold & Sinus     Vicks Vapor Rub  Zyrtec  **AVOID if Problems With Blood Pressure         Heartburn: Avoid lying down for at least 1 hour after meals  Aciphex      Maalox     Rash:  Milk of Magnesia     Benadryl    Mylanta       1% Hydrocortisone Cream  Pepcid  Pepcid Complete   Sleep Aids:  Prevacid      Ambien   Prilosec       Benadryl  Rolaids       Chamomile Tea  Tums (Limit 4/day)     Unisom         Tylenol PM         Warm milk-add vanilla or  Hemorrhoids:       Sugar for taste  Anusol/Anusol H.C.  (RX: Analapram 2.5%)  Sugar Substitutes:  Hydrocortisone OTC     Ok in moderation  Preparation H      Tucks        Vaseline lotion applied to tissue with wiping    Herpes:     Throat:  Acyclovir      Oragel  Famvir  Valtrex     Vaccines:         Flu Shot Leg Cramps:       *Gardasil  Benadryl      Hepatitis A         Hepatitis B Nasal Spray:       Pneumovax  Saline Nasal Spray     Polio Booster  Tetanus Nausea:       Tuberculosis test or PPD  Vitamin B6 25 mg TID   AVOID:    Dramamine      *Gardasil  Emetrol       Live Poliovirus  Ginger Root 250 mg QID    MMR (measles, mumps &  High Complex Carbs @ Bedtime    rebella)  Sea Bands-Accupressure    Varicella (Chickenpox)  Unisom 1/2 tab TID     *No known complications           If received before Pain:         Known pregnancy;   Darvocet       Resume series after  Lortab        Delivery  Percocet    Yeast:   Tramadol      Femstat  Tylenol 3      Gyne-lotrimin  Ultram       Monistat  Vicodin           MISC:         All Sunscreens           Hair Coloring/highlights          Insect Repellant's          (Including DEET)         Mystic Tans

## 2020-12-20 NOTE — Progress Notes (Signed)
Received a transferred call from Elizabeth Harding for a 2 weeks televisit. DOB as identifier. Is breast feeding and doing well with the help of a Advertising copywriter. Light menstrual flow. PhQ9= 6. GAD= 9. Call transferred to [facility] CNM.

## 2021-01-02 ENCOUNTER — Telehealth: Payer: Self-pay

## 2021-01-02 DIAGNOSIS — Z0289 Encounter for other administrative examinations: Secondary | ICD-10-CM

## 2021-01-02 NOTE — Telephone Encounter (Signed)
Called pt Elizabeth Harding. Pt needs to pay $25 for FMLA form fee

## 2021-01-03 ENCOUNTER — Encounter: Payer: BC Managed Care – PPO | Admitting: Certified Nurse Midwife

## 2021-01-11 ENCOUNTER — Ambulatory Visit (INDEPENDENT_AMBULATORY_CARE_PROVIDER_SITE_OTHER): Payer: BC Managed Care – PPO | Admitting: Certified Nurse Midwife

## 2021-01-11 ENCOUNTER — Other Ambulatory Visit: Payer: Self-pay

## 2021-01-11 ENCOUNTER — Encounter: Payer: Self-pay | Admitting: Certified Nurse Midwife

## 2021-01-11 DIAGNOSIS — Z8659 Personal history of other mental and behavioral disorders: Secondary | ICD-10-CM

## 2021-01-11 DIAGNOSIS — Z1331 Encounter for screening for depression: Secondary | ICD-10-CM

## 2021-01-11 NOTE — Progress Notes (Signed)
Subjective:    Elizabeth Harding is a 35 y.o. [phone] Caucasian female who presents for a postpartum visit. She is 6 weeks postpartum following a spontaneous vaginal delivery in waterbirth tub at 39+6 gestational weeks. Anesthesia: local. I have fully reviewed the prenatal and intrapartum course.  Postpartum course has been uncomplicated.   Baby's course has been uncomplicated since revision of tongue tie. Baby is feeding by breast.   Bleeding no bleeding. Bowel function is normal. Bladder function is normal.   Patient is not sexually active. Contraception method is LAM.   Postpartum depression screening: negative. PHQ-9: 2, GAD-7: 8.    Last pap 02/2019 and was Neg/Neg.  Denies difficulty breathing or respiratory distress, chest pain, abdominal pain, excessive vaginal bleeding, dysuria, and leg pain or swelling.   The following portions of the patient's history were reviewed and updated as appropriate: allergies, current medications, past medical history, past surgical history and problem list.  Review of Systems  Pertinent items are noted in HPI.   Objective:   BP 123/62   Pulse 62   Ht 5\' 8"  (1.727 m)   Wt 181 lb 4 oz (82.2 kg)   Breastfeeding Yes   BMI 27.56 kg/m   General:  alert, cooperative and no distress   Breasts:  deferred, no complaints  Lungs: clear to auscultation bilaterally  Heart:  regular rate and rhythm  Abdomen: soft, nontender   Vulva: normal  Vagina: normal vagina  Cervix:  closed  Corpus: Well-involuted  Adnexa:  Non-palpable      Depression screen [facility] 2/9 01/11/2021 12/20/2020  Decreased Interest 0 1  Down, Depressed, Hopeless 0 1  PHQ - 2 Score 0 2  Altered sleeping 0 0  Tired, decreased energy 0 2  Change in appetite 1 1  Feeling bad or failure about yourself  1 1  Trouble concentrating 0 0  Moving slowly or fidgety/restless 0 0  Suicidal thoughts 0 0  PHQ-9 Score 2 6  Difficult doing work/chores Somewhat difficult Somewhat difficult    GAD 7 : Generalized Anxiety Score 01/11/2021 12/20/2020  Nervous, Anxious, on Edge 2 1  Control/stop worrying 0 1  Worry too much - different things 0 0  Trouble relaxing 2 2  Restless 1 1  Easily annoyed or irritable 3 3  Afraid - awful might happen 0 1  Total GAD 7 Score 8 9  Anxiety Difficulty - Very difficult     Assessment:   Postpartum exam Six (6) wks s/p spontaneous vaginal birth in tub Breastfeeding Depression screening History of anxiety Contraception counseling   Plan:   Encouraged routine health maintenance techniques.   Reviewed red flag symptoms and when to call.   Follow up in: 5 months for ANNUAL EXAM or earlier if needed.   12/22/2020, CNM [facility], [facility] 01/11/21 1:26 PM

## 2021-01-11 NOTE — Patient Instructions (Signed)
Preventive Care 21-35 Years Old, Female Preventive care refers to lifestyle choices and visits with your health care provider that can promote health and wellness. This includes:  A yearly physical exam. This is also called an annual wellness visit.  Regular dental and eye exams.  Immunizations.  Screening for certain conditions.  Healthy lifestyle choices, such as: ? Eating a healthy diet. ? Getting regular exercise. ? Not using drugs or products that contain nicotine and tobacco. ? Limiting alcohol use. What can I expect for my preventive care visit? Physical exam Your health care provider may check your:  Height and weight. These may be used to calculate your BMI (body mass index). BMI is a measurement that tells if you are at a healthy weight.  Heart rate and blood pressure.  Body temperature.  Skin for abnormal spots. Counseling Your health care provider may ask you questions about your:  Past medical problems.  Family's medical history.  Alcohol, tobacco, and drug use.  Emotional well-being.  Home life and relationship well-being.  Sexual activity.  Diet, exercise, and sleep habits.  Work and work environment.  Access to firearms.  Method of birth control.  Menstrual cycle.  Pregnancy history. What immunizations do I need? Vaccines are usually given at various ages, according to a schedule. Your health care provider will recommend vaccines for you based on your age, medical history, and lifestyle or other factors, such as travel or where you work.   What tests do I need? Blood tests  Lipid and cholesterol levels. These may be checked every 5 years starting at age 20.  Hepatitis C test.  Hepatitis B test. Screening  Diabetes screening. This is done by checking your blood sugar (glucose) after you have not eaten for a while (fasting).  STD (sexually transmitted disease) testing, if you are at risk.  BRCA-related cancer screening. This may be  done if you have a family history of breast, ovarian, tubal, or peritoneal cancers.  Pelvic exam and Pap test. This may be done every 3 years starting at age 21. Starting at age 30, this may be done every 5 years if you have a Pap test in combination with an HPV test. Talk with your health care provider about your test results, treatment options, and if necessary, the need for more tests.   Follow these instructions at home: Eating and drinking  Eat a healthy diet that includes fresh fruits and vegetables, whole grains, lean protein, and low-fat dairy products.  Take vitamin and mineral supplements as recommended by your health care provider.  Do not drink alcohol if: ? Your health care provider tells you not to drink. ? You are pregnant, may be pregnant, or are planning to become pregnant.  If you drink alcohol: ? Limit how much you have to 0-1 drink a day. ? Be aware of how much alcohol is in your drink. In the [place] one drink equals one 12 oz bottle of beer (355 mL), one 5 oz glass of wine (148 mL), or one 1 oz glass of hard liquor (44 mL).   Lifestyle  Take daily care of your teeth and gums. Brush your teeth every morning and night with fluoride toothpaste. Floss one time each day.  Stay active. Exercise for at least 30 minutes 5 or more days each week.  Do not use any products that contain nicotine or tobacco, such as cigarettes, e-cigarettes, and chewing tobacco. If you need help quitting, ask your health care provider.  Do not   use drugs.  If you are sexually active, practice safe sex. Use a condom or other form of protection to prevent STIs (sexually transmitted infections).  If you do not wish to become pregnant, use a form of birth control. If you plan to become pregnant, see your health care provider for a prepregnancy visit.  Find healthy ways to cope with stress, such as: ? Meditation, yoga, or listening to music. ? Journaling. ? Talking to a trusted  person. ? Spending time with friends and family. Safety  Always wear your seat belt while driving or riding in a vehicle.  Do not drive: ? If you have been drinking alcohol. Do not ride with someone who has been drinking. ? When you are tired or distracted. ? While texting.  Wear a helmet and other protective equipment during sports activities.  If you have firearms in your house, make sure you follow all gun safety procedures.  Seek help if you have been physically or sexually abused. What's next?  Go to your health care provider once a year for an annual wellness visit.  Ask your health care provider how often you should have your eyes and teeth checked.  Stay up to date on all vaccines. This information is not intended to replace advice given to you by your health care provider. Make sure you discuss any questions you have with your health care provider. Document Revised: 07/22/2020 Document Reviewed: 08/05/2018 Elsevier Patient Education  2021 Elsevier Inc.  

## 2021-05-07 ENCOUNTER — Other Ambulatory Visit: Payer: Self-pay | Admitting: Certified Nurse Midwife

## 2021-05-07 DIAGNOSIS — Z8659 Personal history of other mental and behavioral disorders: Secondary | ICD-10-CM

## 2021-05-08 ENCOUNTER — Other Ambulatory Visit: Payer: Self-pay | Admitting: Certified Nurse Midwife

## 2021-05-08 DIAGNOSIS — Z8659 Personal history of other mental and behavioral disorders: Secondary | ICD-10-CM

## 2021-05-08 MED ORDER — BUPROPION HCL ER (XL) 150 MG PO TB24: 150.0000 mg | ORAL_TABLET | Freq: Every day | ORAL | 1 refills | Status: DC

## 2021-05-08 NOTE — Progress Notes (Signed)
Patient wishes to restart Wellbutrin XL. GAD-7:9, see MyChart. Rx Wellbutrin, see orders.    [name], CNM [facility], [facility] 05/08/21 12:32 AM

## 2021-06-21 ENCOUNTER — Encounter: Payer: Self-pay | Admitting: Certified Nurse Midwife

## 2021-06-21 ENCOUNTER — Encounter: Payer: BC Managed Care – PPO | Admitting: Certified Nurse Midwife

## 2021-10-30 ENCOUNTER — Other Ambulatory Visit: Payer: Self-pay

## 2021-10-30 MED ORDER — BUPROPION HCL ER (XL) 150 MG PO TB24: 150.0000 mg | ORAL_TABLET | Freq: Every day | ORAL | 0 refills | Status: DC

## 2021-12-08 NOTE — L&D Delivery Note (Signed)
Delivery Note  First Stage: Labor onset: 0530 Augmentation: none Analgesia /Anesthesia intrapartum: none SROM at 1408  Second Stage: Complete dilation at 1408 Onset of pushing at 1408 FHR second stage 150bpm  Delivery of a viable female infant on 08/11/22 at 1409 by CNM delivery of fetal head in LOA position with restitution to LOT. Loose nuchal cord x 1;  Anterior then posterior shoulders delivered easily with gentle downward traction. Baby placed on mom's chest, and attended to by peds.  Cord double clamped after cessation of pulsation, cut by FOB Cord blood sample collected   Third Stage: Placenta delivered spontaneously intact with 3VC @ 1419 Placenta disposition: routine disposal Uterine tone firm / bleeding scant  Perineal abrasion noted, hemostatic, pt declined repair.  Est. Blood Loss (mL): 100  Complications: none  Mom to postpartum.  Baby to Couplet care / Skin to Skin.  Newborn: Birth Weight: 7#11  Apgar Scores: 8/9 Feeding planned: breast

## 2022-02-20 LAB — OB RESULTS CONSOLE VARICELLA ZOSTER ANTIBODY, IGG: Varicella: IMMUNE

## 2022-02-20 LAB — OB RESULTS CONSOLE HEPATITIS B SURFACE ANTIGEN: Hepatitis B Surface Ag: NEGATIVE

## 2022-02-20 LAB — OB RESULTS CONSOLE RUBELLA ANTIBODY, IGM: Rubella: IMMUNE

## 2022-07-29 LAB — OB RESULTS CONSOLE GC/CHLAMYDIA
Chlamydia: NEGATIVE
Neisseria Gonorrhea: NEGATIVE

## 2022-07-29 LAB — OB RESULTS CONSOLE RPR: RPR: NONREACTIVE

## 2022-07-29 LAB — OB RESULTS CONSOLE GBS: GBS: NEGATIVE

## 2022-07-29 LAB — OB RESULTS CONSOLE HIV ANTIBODY (ROUTINE TESTING): HIV: NONREACTIVE

## 2022-08-11 ENCOUNTER — Inpatient Hospital Stay
Admission: EM | Admit: 2022-08-11 | Discharge: 2022-08-12 | DRG: 806 | Disposition: A | Discharge status: Home / Self Care | Payer: BC Managed Care – PPO | Attending: Obstetrics and Gynecology | Admitting: Obstetrics and Gynecology

## 2022-08-11 ENCOUNTER — Other Ambulatory Visit: Payer: Self-pay

## 2022-08-11 ENCOUNTER — Encounter: Payer: Self-pay | Admitting: Obstetrics and Gynecology

## 2022-08-11 DIAGNOSIS — J45909 Unspecified asthma, uncomplicated: Secondary | ICD-10-CM | POA: Diagnosis present

## 2022-08-11 DIAGNOSIS — Z8616 Personal history of COVID-19: Secondary | ICD-10-CM

## 2022-08-11 DIAGNOSIS — O9081 Anemia of the puerperium: Secondary | ICD-10-CM | POA: Diagnosis not present

## 2022-08-11 DIAGNOSIS — O9952 Diseases of the respiratory system complicating childbirth: Secondary | ICD-10-CM | POA: Diagnosis present

## 2022-08-11 DIAGNOSIS — O26893 Other specified pregnancy related conditions, third trimester: Secondary | ICD-10-CM | POA: Diagnosis present

## 2022-08-11 DIAGNOSIS — Z6791 Unspecified blood type, Rh negative: Secondary | ICD-10-CM

## 2022-08-11 DIAGNOSIS — Z3A38 38 weeks gestation of pregnancy: Secondary | ICD-10-CM | POA: Diagnosis not present

## 2022-08-11 DIAGNOSIS — D62 Acute posthemorrhagic anemia: Secondary | ICD-10-CM | POA: Diagnosis not present

## 2022-08-11 LAB — CBC
HCT: 36 % (ref 36.0–46.0)
Hemoglobin: 12.2 g/dL (ref 12.0–15.0)
MCH: 30 pg (ref 26.0–34.0)
MCHC: 33.9 g/dL (ref 30.0–36.0)
MCV: 88.7 fL (ref 80.0–100.0)
Platelets: 262 10*3/uL (ref 150–400)
RBC: 4.06 MIL/uL (ref 3.87–5.11)
RDW: 14.5 % (ref 11.5–15.5)
WBC: 14.9 10*3/uL — ABNORMAL HIGH (ref 4.0–10.5)
nRBC: 0 % (ref 0.0–0.2)

## 2022-08-11 LAB — TYPE AND SCREEN
ABO/RH(D): O NEG
Antibody Screen: POSITIVE

## 2022-08-11 MED ORDER — BUPROPION HCL ER (XL) 150 MG PO TB24: 150.0000 mg | ORAL_TABLET | Freq: Every day | ORAL | Status: DC

## 2022-08-11 MED ORDER — COCONUT OIL OIL: 1.0000 | TOPICAL_OIL | Status: DC | PRN

## 2022-08-11 MED ORDER — LIDOCAINE HCL (PF) 1 % IJ SOLN
INTRAMUSCULAR | Status: AC
  Filled 2022-08-11: qty 30

## 2022-08-11 MED ORDER — ACETAMINOPHEN 325 MG PO TABS: 650.0000 mg | ORAL_TABLET | ORAL | Status: DC | PRN

## 2022-08-11 MED ORDER — LIDOCAINE HCL (PF) 1 % IJ SOLN: 30.0000 mL | INTRAMUSCULAR | Status: DC | PRN

## 2022-08-11 MED ORDER — ONDANSETRON HCL 4 MG PO TABS: 4.0000 mg | ORAL_TABLET | ORAL | Status: DC | PRN

## 2022-08-11 MED ORDER — SIMETHICONE 80 MG PO CHEW: 80.0000 mg | CHEWABLE_TABLET | ORAL | Status: DC | PRN

## 2022-08-11 MED ORDER — WITCH HAZEL-GLYCERIN EX PADS: 1.0000 | MEDICATED_PAD | CUTANEOUS | Status: DC | PRN

## 2022-08-11 MED ORDER — LACTATED RINGERS IV SOLN: INTRAVENOUS | Status: DC

## 2022-08-11 MED ORDER — BENZOCAINE-MENTHOL 20-0.5 % EX AERO: 1.0000 | INHALATION_SPRAY | CUTANEOUS | Status: DC | PRN

## 2022-08-11 MED ORDER — OXYTOCIN-SODIUM CHLORIDE 30-0.9 UT/500ML-% IV SOLN: 2.5000 [IU]/h | INTRAVENOUS | Status: DC

## 2022-08-11 MED ORDER — OXYTOCIN-SODIUM CHLORIDE 30-0.9 UT/500ML-% IV SOLN
INTRAVENOUS | Status: AC
  Filled 2022-08-11: qty 500

## 2022-08-11 MED ORDER — DIPHENHYDRAMINE HCL 25 MG PO CAPS: 25.0000 mg | ORAL_CAPSULE | Freq: Four times a day (QID) | ORAL | Status: DC | PRN

## 2022-08-11 MED ORDER — OXYTOCIN BOLUS FROM INFUSION
333.0000 mL | Freq: Once | INTRAVENOUS | Status: AC
  Administered 2022-08-11: 333 mL via INTRAVENOUS

## 2022-08-11 MED ORDER — MISOPROSTOL 200 MCG PO TABS
ORAL_TABLET | ORAL | Status: AC
  Filled 2022-08-11: qty 4

## 2022-08-11 MED ORDER — ZOLPIDEM TARTRATE 5 MG PO TABS: 5.0000 mg | ORAL_TABLET | Freq: Every evening | ORAL | Status: DC | PRN

## 2022-08-11 MED ORDER — ONDANSETRON HCL 4 MG/2ML IJ SOLN: 4.0000 mg | Freq: Four times a day (QID) | INTRAMUSCULAR | Status: DC | PRN

## 2022-08-11 MED ORDER — FENTANYL CITRATE (PF) 100 MCG/2ML IJ SOLN: 50.0000 ug | INTRAMUSCULAR | Status: DC | PRN

## 2022-08-11 MED ORDER — AMMONIA AROMATIC IN INHA
RESPIRATORY_TRACT | Status: AC
  Filled 2022-08-11: qty 10

## 2022-08-11 MED ORDER — IBUPROFEN 600 MG PO TABS
600.0000 mg | ORAL_TABLET | Freq: Four times a day (QID) | ORAL | Status: DC
  Administered 2022-08-11 – 2022-08-12 (×4): 600 mg via ORAL
  Filled 2022-08-11 (×4): qty 1

## 2022-08-11 MED ORDER — SENNOSIDES-DOCUSATE SODIUM 8.6-50 MG PO TABS
2.0000 | ORAL_TABLET | Freq: Every day | ORAL | Status: DC
  Administered 2022-08-12: 2 via ORAL
  Filled 2022-08-11 (×2): qty 2

## 2022-08-11 MED ORDER — ONDANSETRON HCL 4 MG/2ML IJ SOLN: 4.0000 mg | INTRAMUSCULAR | Status: DC | PRN

## 2022-08-11 MED ORDER — LACTATED RINGERS IV SOLN: 500.0000 mL | INTRAVENOUS | Status: DC | PRN

## 2022-08-11 MED ORDER — FERROUS SULFATE 325 (65 FE) MG PO TABS
325.0000 mg | ORAL_TABLET | Freq: Two times a day (BID) | ORAL | Status: DC
  Administered 2022-08-11 – 2022-08-12 (×2): 325 mg via ORAL
  Filled 2022-08-11 (×2): qty 1

## 2022-08-11 MED ORDER — DIBUCAINE (PERIANAL) 1 % EX OINT: 1.0000 | TOPICAL_OINTMENT | CUTANEOUS | Status: DC | PRN

## 2022-08-11 MED ORDER — SOD CITRATE-CITRIC ACID 500-334 MG/5ML PO SOLN: 30.0000 mL | ORAL | Status: DC | PRN

## 2022-08-11 MED ORDER — PRENATAL MULTIVITAMIN CH
1.0000 | ORAL_TABLET | Freq: Every day | ORAL | Status: DC
  Administered 2022-08-12: 1 via ORAL
  Filled 2022-08-11: qty 1

## 2022-08-11 MED ORDER — OXYTOCIN 10 UNIT/ML IJ SOLN
INTRAMUSCULAR | Status: AC
  Filled 2022-08-11: qty 2

## 2022-08-11 NOTE — Discharge Summary (Addendum)
Obstetrical Discharge Summary  Patient Name: Elizabeth Harding DOB: 20-May-1986 MRN: [id number]  Date of Admission: 08/11/2022 Date of Delivery: 08/11/22 Delivered by: Heloise Ochoa, CNM  Date of Discharge: 08/12/2022  Primary OB: [facility] BOF:[id number] last menstrual period was 11/16/2021 (approximate). EDC Estimated Date of Delivery: 08/23/22 Gestational Age at Delivery: [redacted]w[redacted]d   Antepartum complications:  AMA, 36yo Rh negative; given 6/29 Covid in pregnancy 01/20/22 History of postpartum depression with G2 and G3; has used Buproprion in the past.  Hx SAB and low progesterone, used vaginal progesterone until 12wks  Admitting Diagnosis: Labor and delivery, indication for care [O75.9]  Secondary Diagnosis: Patient Active Problem List   Diagnosis Date Noted   NSVD (normal spontaneous vaginal delivery) 08/12/2022   Acute blood loss anemia 08/12/2022   Type O blood, Rh negative 07/12/2020   Exercise-induced asthma 05/24/2012   Abnormal pap 05/24/2012    Discharge Diagnosis: Term Pregnancy Delivered      Augmentation: N/A Complications: None Intrapartum complications/course: admitted in active labor, expectant mgmt, see delivery notes.  Delivery Type: spontaneous vaginal delivery Anesthesia: non-pharmacological methods Placenta: spontaneous To Pathology: No  Laceration: perineal abrasion Episiotomy: none Newborn Data: Live born female "Zoe" Birth Weight: 7 lb 11.1 oz (3490 g) APGAR: 8, 9  Newborn Delivery   Birth date/time: 08/11/2022 14:09:00 Delivery type: Vaginal, Spontaneous      Postpartum Procedures: Rho(D) Ig Edinburgh:     08/11/2022   11:10 PM 08/20/2017    6:00 AM  Edinburgh Postnatal Depression Scale Screening Tool  I have been able to laugh and see the funny side of things. 0 0  I have looked forward with enjoyment to things. 0 0  I have blamed myself unnecessarily when things went wrong. 2 0  I have been anxious or worried for no good reason. 0 1   I have felt scared or panicky for no good reason. 0 1  Things have been getting on top of me. 1 0  I have been so unhappy that I have had difficulty sleeping. 0 0  I have felt sad or miserable. 0 0  I have been so unhappy that I have been crying. 0 0  The thought of harming myself has occurred to me. 0 0  Edinburgh Postnatal Depression Scale Total 3 2     Post partum course:  Patient had an uncomplicated postpartum course.  By time of discharge on PPD#1, her pain was controlled on oral pain medications; she had appropriate lochia and was ambulating, voiding without difficulty and tolerating regular diet.  She was deemed stable for discharge to home.     Discharge Physical Exam:  BP 106/60 (BP Location: Left Arm)   Pulse 65   Temp 98.4 F (36.9 C) (Oral)   Resp 20   Ht 5\' 7"  (1.702 m)   Wt 91.6 kg   LMP 11/16/2021 (Approximate)   SpO2 100%   Breastfeeding Unknown   BMI 31.64 kg/m   General: NAD CV: RRR Pulm: CTABL, nl effort ABD: s/nd/nt, fundus firm and below the umbilicus Lochia: moderate Perineum: minimal edema/laceration hemostatic DVT Evaluation: LE non-ttp, no evidence of DVT on exam.  Hemoglobin  Date Value Ref Range Status  08/12/2022 10.8 (L) 12.0 - 15.0 g/dL Final  10/12/2022 27/78/2423 11.1 - 15.9 g/dL Final  53.6 14/43/1540  Final   HCT  Date Value Ref Range Status  08/12/2022 32.4 (L) 36.0 - 46.0 % Final  05/15/2020 34 29 - 41 Final   Hematocrit  Date Value  Ref Range Status  09/07/2020 33.9 (L) 34.0 - 46.6 % Final    Risk assessment for postpartum VTE and prophylactic treatment: Very high risk factors: None High risk factors: None Moderate risk factors: None  Postpartum VTE prophylaxis with LMWH not indicated  Disposition: stable, discharge to home. Baby Feeding: breast feeding Baby Disposition: home with mom  Rh Immune globulin indicated: Yes: given prior to discharge Rubella vaccine given: was not indicated Varivax vaccine given: was not  indicated Flu vaccine given in AP setting: n/a Tdap vaccine given in AP setting: Yes   Contraception: oral progesterone-only contraceptive  Prenatal Labs:  Blood type/Rh O Neg; rhogam given 06/05/22  Antibody screen neg  Rubella Immune  Varicella Immune  RPR NR  HBsAg Neg  HIV NR  GC neg  Chlamydia neg  Genetic screening  declined  1 hour GTT  87  3 hour GTT    GBS  Neg    Plan:  Shantera Monts was discharged to home in good condition.   Discharge Medications: Allergies as of 08/12/2022       Reactions   Sunscreens Rash        Medication List     STOP taking these medications    BENEFIBER DRINK MIX PO   buPROPion 150 MG 24 hr tablet Commonly known as: Wellbutrin XL   famotidine 20 MG tablet Commonly known as: PEPCID   ferrous sulfate 325 (65 FE) MG EC tablet Replaced by: ferrous sulfate 325 (65 FE) MG tablet   PROBIOTIC GOLD EXTRA STRENGTH PO       TAKE these medications    albuterol 108 (90 Base) MCG/ACT inhaler Commonly known as: VENTOLIN HFA Inhale 2 puffs into the lungs every 6 (six) hours as needed for wheezing or shortness of breath.   ferrous sulfate 325 (65 FE) MG tablet Take 1 tablet (325 mg total) by mouth daily with breakfast. Replaces: ferrous sulfate 325 (65 FE) MG EC tablet   prenatal multivitamin Tabs tablet Take 1 tablet by mouth at bedtime.         Follow-up Information     McVey, Prudencio Pair, CNM. Schedule an appointment as soon as possible for a visit in 2 week(s).   Specialty: Obstetrics and Gynecology Why: for mood check Contact information: [address] [place] [address] [place] [address] [phone]                 Signed: Cyril Mourning  08/12/2022 3:23 PM

## 2022-08-11 NOTE — H&P (Signed)
OB History & Physical   History of Present Illness:  Chief Complaint: labor  HPI:  Elizabeth Harding is a 36 y.o. [phone] female at [redacted]w[redacted]d dated by LMP and c/w [place] at 8+2wks; EDD 08/22/22.  She presents to L&D for active labor, reports UCs q3-13min, denies LOF or VB. Reports FM noted.    Pregnancy Issues: AMA, 36yo Rh negative; given 6/29 Covid in pregnancy 01/20/22 History of postpartum depression with G2 and G3; has used Buproprion in the past.  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 Authorizing Provider  albuterol (VENTOLIN HFA) 108 (90 Base) MCG/ACT inhaler Inhale 2 puffs into the lungs every 6 (six) hours as needed for wheezing or shortness of breath. 07/12/20   Lawhorn, Vanessa Thebes, CNM  buPROPion (WELLBUTRIN XL) 150 MG 24 hr tablet Take 1 tablet (150 mg total) by mouth daily. 10/30/21   Hildred Laser, MD  Lactobacillus (PROBIOTIC GOLD EXTRA STRENGTH PO) Take by mouth.    [provider]  Prenatal Vit-Fe Fumarate-FA (PRENATAL MULTIVITAMIN) TABS tablet Take 1 tablet by mouth at bedtime.    [provider]  Wheat Dextrin (BENEFIBER DRINK MIX PO) Take by mouth.    [provider]     Prenatal care site: [facility]  Social History: She  reports that she has never smoked. She has never used  smokeless tobacco. She reports that she does not currently use alcohol. She reports that she does not use drugs.  Family History: family history includes Heart disease in her paternal grandfather and paternal grandmother; Hypertension in an other family member; Rheum arthritis in her maternal grandmother and paternal grandmother.   Review of Systems: A full review of systems was performed and negative except as noted in the HPI.     Physical Exam:  Vital Signs: BP 112/60 (BP Location: Left Arm)   Pulse 66   Temp 98 F (36.7 C) (Oral)   Resp 18   Ht 5\' 7"  (1.702 m)   Wt 91.6 kg   LMP 11/16/2021 (Approximate)   SpO2 100%   BMI 31.64 kg/m   General: no acute distress.  HEENT: normocephalic, atraumatic Heart: regular rate & rhythm.  No murmurs/rubs/gallops Lungs: clear to auscultation bilaterally, normal respiratory effort Abdomen: soft, gravid, non-tender;  EFW: 7lbs Pelvic:   External: Normal external female genitalia  Cervix: Dilation: 8 / Effacement (%): 90 / Station: -1    Extremities: non-tender, symmetric, no edema bilaterally.  DTRs: 2+  Neurologic: Alert & oriented x 3.    Results for orders placed or performed during the hospital encounter of 08/11/22 (from the past 24 hour(s))  CBC     Status: Abnormal   Collection Time: 08/11/22 12:56 PM  Result Value  Ref Range   WBC 14.9 (H) 4.0 - 10.5 K/uL   RBC 4.06 3.87 - 5.11 MIL/uL   Hemoglobin 12.2 12.0 - 15.0 g/dL   HCT 85.4 62.7 - 03.5 %   MCV 88.7 80.0 - 100.0 fL   MCH 30.0 26.0 - 34.0 pg   MCHC 33.9 30.0 - 36.0 g/dL   RDW 00.9 38.1 - 82.9 %   Platelets 262 150 - 400 K/uL   nRBC 0.0 0.0 - 0.2 %  Type and screen [facility]     Status: None (Preliminary result)   Collection Time: 08/11/22 12:57 PM  Result Value Ref Range   ABO/RH(D) PENDING    Antibody Screen PENDING    Sample Expiration      08/14/2022,2359 Performed at [facility], [address], [place], [place] [address]      Pertinent Results:  Prenatal Labs: Blood type/Rh O Neg; rhogam given 06/05/22  Antibody screen neg  Rubella Immune  Varicella Immune  RPR NR  HBsAg Neg  HIV NR  GC neg  Chlamydia neg  Genetic screening  declined  1 hour GTT  87  3 hour GTT   GBS  Neg   FHT: 140bpm, mod var, + accels, early decels TOCO: q2-110min, palp mild to mod SVE: per RN exam   Dilation: 8 / Effacement (%): 90 / Station: -1    Cephalic by leopolds/SVE  No results found.  Assessment:  Anastazia Creek is a 36 y.o. [phone] female at [redacted]w[redacted]d with active labor  Plan:  1. Admit to Labor & Delivery; consents reviewed and obtained - Dr Dalbert Garnet notified of admission.   2. Fetal Well being  - Fetal Tracing: Cat I - Group B Streptococcus ppx indicated: Neg - Presentation: cephalic confirmed by exam   3. Routine OB: - Prenatal labs reviewed, as above - Rh O neg - CBC, T&S, RPR on admit - Clear fluids, saline lock  4. Monitoring of Labor -  Contractions: external toco in place -  Pelvis proven to 8#4 -  Plan for expectant mgmt -  Plan for continuous fetal monitoring  -  Maternal pain control as desired - Anticipate vaginal delivery  5. Post Partum Planning: - Infant feeding: breast - Contraception: vasectomy and mini-pill - Tdap 06/05/22  Prudencio Pair Chaniece Barbato, CNM 08/11/22 1:21 PM

## 2022-08-12 DIAGNOSIS — D62 Acute posthemorrhagic anemia: Secondary | ICD-10-CM

## 2022-08-12 LAB — RPR: RPR Ser Ql: NONREACTIVE

## 2022-08-12 LAB — FETAL SCREEN: Fetal Screen: NEGATIVE

## 2022-08-12 LAB — CBC
HCT: 32.4 % — ABNORMAL LOW (ref 36.0–46.0)
Hemoglobin: 10.8 g/dL — ABNORMAL LOW (ref 12.0–15.0)
MCH: 30 pg (ref 26.0–34.0)
MCHC: 33.3 g/dL (ref 30.0–36.0)
MCV: 90 fL (ref 80.0–100.0)
Platelets: 263 10*3/uL (ref 150–400)
RBC: 3.6 MIL/uL — ABNORMAL LOW (ref 3.87–5.11)
RDW: 15 % (ref 11.5–15.5)
WBC: 12 10*3/uL — ABNORMAL HIGH (ref 4.0–10.5)
nRBC: 0 % (ref 0.0–0.2)

## 2022-08-12 MED ORDER — FERROUS SULFATE 325 (65 FE) MG PO TABS
325.0000 mg | ORAL_TABLET | Freq: Every day | ORAL | Status: DC
Start: 2022-08-12 — End: 2023-03-25

## 2022-08-12 MED ORDER — RHO D IMMUNE GLOBULIN 1500 UNIT/2ML IJ SOSY
300.0000 ug | PREFILLED_SYRINGE | Freq: Once | INTRAMUSCULAR | Status: AC
Start: 2022-08-12 — End: 2022-08-12
  Administered 2022-08-12: 300 ug via INTRAMUSCULAR
  Filled 2022-08-12: qty 2

## 2022-08-12 NOTE — Progress Notes (Signed)
Reviewed all patients discharge instructions and handouts regarding postpartum bleeding, no intercourse for 6 weeks, signs and symptoms of mastitis and postpartum bleu's. Reviewed discharge instructions for newborn regarding proper cord care, how and when to bathe the newborn, nail care, proper way to take the baby's temperature, along with safe sleep. All questions have been answered at this time. Patient discharged via wheelchair with axillary.  

## 2022-08-13 LAB — RHOGAM INJECTION: Unit division: 0

## 2022-08-23 ENCOUNTER — Inpatient Hospital Stay (HOSPITAL_COMMUNITY): Admit: 2022-08-23 | Payer: Self-pay | Admitting: Obstetrics and Gynecology

## 2022-09-27 LAB — HM PAP SMEAR

## 2022-09-27 LAB — RESULTS CONSOLE HPV: CHL HPV: NEGATIVE

## 2022-11-11 ENCOUNTER — Ambulatory Visit: Payer: Self-pay

## 2023-03-21 DIAGNOSIS — L309 Dermatitis, unspecified: Secondary | ICD-10-CM | POA: Insufficient documentation

## 2023-03-21 NOTE — Patient Instructions (Signed)
Asthma, Adult  Asthma is a condition that causes swelling and narrowing of the airways. These are the passages that lead from the nose and mouth down into the lungs. When asthma symptoms get worse it is called an asthma attack or flare. This can make it hard to breathe. Asthma flares can range from minor to life-threatening. There is no cure for asthma, but medicines and lifestyle changes can help to control it. What are the causes? It is not known exactly what causes asthma, but certain things can cause asthma symptoms to get worse (triggers). What can trigger an asthma attack? Cigarette smoke. Mold. Dust. Your pet's skin flakes (dander). Cockroaches. Pollen. Air pollution (like household cleaners, wood smoke, smog, or chemical odors). What are the signs or symptoms? Trouble breathing (shortness of breath). Coughing. Making high-pitched whistling sounds when you breathe, most often when you breathe out (wheezing). Chest tightness. Tiredness with little activity. Poor exercise tolerance. How is this treated? Controller medicines that help prevent asthma symptoms. Fast-acting reliever or rescue medicines. These give short-term relief of asthma symptoms. Allergy medicines if your attacks are brought on by allergens. Medicines to help control the body's defense (immune) system. Staying away from the things that cause asthma attacks. Follow these instructions at home: Avoiding triggers in your home Do not allow anyone to smoke in your home. Limit use of fireplaces and wood stoves. Get rid of pests (such as roaches and mice) and their droppings. Keep your home clean. Clean your floors. Dust regularly. Use cleaning products that do not smell. Wash bed sheets and blankets every week in hot water. Dry them in a dryer. Have someone vacuum when you are not home. Change your heating and air conditioning filters often. Use blankets that are made of polyester or cotton. General  instructions Take over-the-counter and prescription medicines only as told by your doctor. Do not smoke or use any products that contain nicotine or tobacco. If you need help quitting, ask your doctor. Stay away from secondhand smoke. Avoid doing things outdoors when allergen counts are high and when air quality is low. Warm up before you exercise. Take time to cool down after exercise. Use a peak flow meter as told by your doctor. A peak flow meter is a tool that measures how well your lungs are working. Keep track of the peak flow meter's readings. Write them down. Follow your asthma action plan. This is a written plan for taking care of your asthma and treating your attacks. Make sure you get all the shots (vaccines) that your doctor recommends. Ask your doctor about a flu shot and a pneumonia shot. Keep all follow-up visits. Contact a doctor if: You have wheezing, shortness of breath, or a cough even while taking medicine to prevent attacks. The mucus you cough up (sputum) is thicker than usual. The mucus you cough up changes from clear or white to yellow, green, gray, or is bloody. You have problems from the medicine you are taking, such as: A rash. Itching. Swelling. Trouble breathing. You need reliever medicines more than 2-3 times a week. Your peak flow reading is still at 50-79% of your personal best after following the action plan for 1 hour. You have a fever. Get help right away if: You seem to be worse and are not responding to medicine during an asthma attack. You are short of breath even at rest. You get short of breath when doing very little activity. You have trouble eating, drinking, or talking. You have chest   pain or tightness. You have a fast heartbeat. Your lips or fingernails start to turn blue. You are light-headed or dizzy, or you faint. Your peak flow is less than 50% of your personal best. You feel too tired to breathe normally. These symptoms may be an  emergency. Get help right away. Call 911. Do not wait to see if the symptoms will go away. Do not drive yourself to the hospital. Summary Asthma is a long-term (chronic) condition in which the airways get tight and narrow. An asthma attack can make it hard to breathe. Asthma cannot be cured, but medicines and lifestyle changes can help control it. Make sure you understand how to avoid triggers and how and when to use your medicines. Avoid things that can cause allergy symptoms (allergens). These include animal skin flakes (dander) and pollen from trees or grass. Avoid things that pollute the air. These may include household cleaners, wood smoke, smog, or chemical odors. This information is not intended to replace advice given to you by your health care provider. Make sure you discuss any questions you have with your health care provider. Document Revised: 09/02/2021 Document Reviewed: 09/02/2021 Elsevier Patient Education  2023 Elsevier Inc.  

## 2023-03-26 ENCOUNTER — Encounter: Payer: Self-pay | Admitting: Nurse Practitioner

## 2023-03-26 ENCOUNTER — Ambulatory Visit: Payer: BC Managed Care – PPO | Admitting: Nurse Practitioner

## 2023-03-26 VITALS — BP 112/74 | HR 60 | Temp 98.5°F | Ht 68.1 in | Wt 191.2 lb

## 2023-03-26 DIAGNOSIS — J45990 Exercise induced bronchospasm: Secondary | ICD-10-CM | POA: Diagnosis not present

## 2023-03-26 DIAGNOSIS — F419 Anxiety disorder, unspecified: Secondary | ICD-10-CM | POA: Diagnosis not present

## 2023-03-26 DIAGNOSIS — F32A Depression, unspecified: Secondary | ICD-10-CM | POA: Diagnosis not present

## 2023-03-26 DIAGNOSIS — Z7689 Persons encountering health services in other specified circumstances: Secondary | ICD-10-CM

## 2023-03-26 NOTE — Progress Notes (Signed)
New Patient Office Visit  Subjective    Patient ID: Elizabeth Harding, female    DOB: 1986/07/08  Age: 37 y.o. MRN: [id number]  CC:  Chief Complaint  Patient presents with   Depression    HPI Elizabeth Harding presents for new patient visit to establish care.  Introduced to Publishing rights manager role and practice setting.  All questions answered.  Discussed provider/patient relationship and expectations.  Has not seen primary care provider in awhile, was followed by OB/GYN.  Has 4 children = 7, 5, 2, and 7 months.  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    Has exercise-induced asthma, currently no inhalers.  Does use when on regular exercise pattern.  DEPRESSION Currently takes Wellbutrin, started 4 weeks after giving birth.   Mood status: stable Satisfied with current treatment?: yes Symptom severity: mild  Duration of current treatment : chronic Side effects: no Medication compliance: good compliance Psychotherapy/counseling: yes in the past -- plans to start again this summer Depressed mood: no Anxious mood: occasional -- irritability on occasion Anhedonia: no Significant weight loss or gain: no Insomnia: none Fatigue: yes has 13 month old Feelings of worthlessness or guilt: no Impaired concentration/indecisiveness: no Suicidal ideations: no Hopelessness: no Crying spells: no    03/26/2023    8:30 AM 01/11/2021    8:56 AM 12/20/2020   10:12 AM  Depression screen PHQ 2/9  Decreased Interest 0 0 1  Down, Depressed, Hopeless 0 0 1  PHQ - 2 Score 0 0 2  Altered sleeping 0 0 0  Tired, decreased energy 2 0 2  Change in appetite 1 1 1   Feeling bad or failure about yourself  1 1 1   Trouble concentrating 0 0 0  Moving slowly or fidgety/restless 0 0 0  Suicidal thoughts 0 0 0  PHQ-9 Score 4 2 6   Difficult doing work/chores Somewhat difficult Somewhat difficult Somewhat difficult       03/26/2023    8:31 AM 01/11/2021    1:26 PM 12/20/2020   10:19 AM  GAD 7 :  Generalized Anxiety Score  Nervous, Anxious, on Edge 1 2 1   Control/stop worrying 1 0 1  Worry too much - different things 0 0 0  Trouble relaxing 1 2 2   Restless 0 1 1  Easily annoyed or irritable 2 3 3   Afraid - awful might happen 0 0 1  Total GAD 7 Score 5 8 9   Anxiety Difficulty Somewhat difficult  Very difficult    Outpatient Encounter Medications as of 03/26/2023  Medication Sig   buPROPion (WELLBUTRIN XL) 150 MG 24 hr tablet Take 150 mg by mouth daily.   Prenatal Vit-Fe Fumarate-FA (PRENATAL MULTIVITAMIN) TABS tablet Take 1 tablet by mouth daily at 12 noon.   [DISCONTINUED] albuterol (PROAIR HFA) 108 (90 Base) MCG/ACT inhaler Inhale 2 puffs into the lungs every 6 (six) hours as needed.   [DISCONTINUED] albuterol (VENTOLIN HFA) 108 (90 Base) MCG/ACT inhaler Inhale 2 puffs into the lungs every 6 (six) hours as needed for wheezing or shortness of breath.   [DISCONTINUED] famotidine (PEPCID) 20 MG tablet Take 20 mg by mouth at bedtime.   [DISCONTINUED] ferrous sulfate 325 (65 FE) MG tablet Take 1 tablet (325 mg total) by mouth daily with breakfast.   [DISCONTINUED] Prenatal Vit-Fe Fumarate-FA (PRENATAL MULTIVITAMIN) TABS tablet Take 1 tablet by mouth at bedtime.   No facility-administered encounter medications on file as of 03/26/2023.    Past Medical History:  Diagnosis Date   Allergic  rhinitis due to pollen    hay fever   Allergy    Seasonal   Anxiety    with aggitation and anger   Asthma    Chicken pox    Depression 2015   GERD (gastroesophageal reflux disease)    During pregnancy   Irregular intermenstrual bleeding    Jaundice    as a newborn   Traumatic injury during pregnancy in third trimester 12/24/2015    Past Surgical History:  Procedure Laterality Date   CERVICAL BIOPSY  W/ LOOP ELECTRODE EXCISION  08/09/2011   CIN[facility], [facility]   WISDOM TOOTH EXTRACTION      Family History  Problem Relation Age of Onset   Hypertension Father     Hyperlipidemia Sister    Rheum arthritis Maternal Grandmother    Osteoporosis Maternal Grandmother    Heart disease Paternal Grandmother    Rheum arthritis Paternal Grandmother    Heart disease Paternal Grandfather    Hypertension Other     Social History   Socioeconomic History   Marital status: Married    Spouse name: Not on file   Number of children: Not on file   Years of education: Not on file   Highest education level: Not on file  Occupational History   Not on file  Tobacco Use   Smoking status: Never   Smokeless tobacco: Never  Vaping Use   Vaping Use: Never used  Substance and Sexual Activity   Alcohol use: Yes    Alcohol/week: 1.0 standard drink of alcohol    Types: 1 Cans of beer per week    Comment: occas   Drug use: No   Sexual activity: Yes    Partners: Male    Birth control/protection: Condom, Surgical    Comment: vasectomy  Other Topics Concern   Not on file  Social History Narrative   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.Exercise - [place], with trainerDiet - regular   Social Determinants of Health   Financial Resource Strain: Low Risk  (03/26/2023)   Overall Financial Resource Strain (CARDIA)    Difficulty of Paying Living Expenses: Not hard at all  Food Insecurity: No Food Insecurity (03/26/2023)   Hunger Vital Sign    Worried About Running Out of Food in the Last Year: Never true    Ran Out of Food in the Last Year: Never true  Transportation Needs: No Transportation Needs (03/26/2023)   PRAPARE - Administrator, Civil Service (Medical): No    Lack of Transportation (Non-Medical): No  Physical Activity: Inactive (03/26/2023)   Exercise Vital Sign    Days of Exercise per Week: 0 days    Minutes of Exercise per Session: 0 min  Stress: No Stress Concern Present (03/26/2023)   [employer] of Occupational Health - Occupational Stress Questionnaire    Feeling of Stress : Not at all  Social Connections: Moderately Integrated (03/26/2023)    Social Connection and Isolation Panel [NHANES]    Frequency of Communication with Friends and Family: More than three times a week    Frequency of Social Gatherings with Friends and Family: More than three times a week    Attends Religious Services: More than 4 times per year    Active Member of [employer] or Organizations: No    Attends Banker Meetings: Never    Marital Status: Married  Catering manager Violence: Not At Risk (03/26/2023)   Humiliation, Afraid, Rape, and Kick questionnaire    Fear of  Current or Ex-Partner: No    Emotionally Abused: No    Physically Abused: No    Sexually Abused: No   Review of Systems  Constitutional:  Negative for chills, diaphoresis, fever and weight loss.  Respiratory:  Negative for cough, shortness of breath and wheezing.   Cardiovascular:  Negative for chest pain, palpitations, orthopnea and leg swelling.  Neurological: Negative.   Endo/Heme/Allergies: Negative.   Psychiatric/Behavioral: Negative.        Objective    BP 112/74   Pulse 60   Temp 98.5 F (36.9 C) (Oral)   Ht 5' 8.1" (1.73 m)   Wt 191 lb 3.2 oz (86.7 kg)   LMP 11/07/2021   SpO2 98%   Breastfeeding Yes   BMI 28.99 kg/m   Physical Exam Vitals and nursing note reviewed.  Constitutional:      General: She is awake.     Appearance: She is well-developed.  HENT:     Head: Normocephalic.     Right Ear: Hearing normal.     Left Ear: Hearing normal.     Nose: Nose normal.     Mouth/Throat:     Mouth: Mucous membranes are moist.  Eyes:     General: Lids are normal.        Right eye: No discharge.        Left eye: No discharge.     Conjunctiva/sclera: Conjunctivae normal.     Pupils: Pupils are equal, round, and reactive to light.  Neck:     Thyroid: No thyromegaly.     Vascular: No carotid bruit or JVD.  Cardiovascular:     Rate and Rhythm: Normal rate and regular rhythm.     Heart sounds: Normal heart sounds. No murmur heard.    No gallop.  Pulmonary:      Effort: Pulmonary effort is normal.     Breath sounds: Normal breath sounds.  Abdominal:     General: Bowel sounds are normal. There is no distension.     Palpations: Abdomen is soft.     Tenderness: There is no abdominal tenderness.  Musculoskeletal:     Cervical back: Normal range of motion and neck supple.     Right lower leg: No edema.     Left lower leg: No edema.  Lymphadenopathy:     Cervical: No cervical adenopathy.  Skin:    General: Skin is warm and dry.  Neurological:     Mental Status: She is alert and oriented to person, place, and time.  Psychiatric:        Attention and Perception: Attention normal.        Mood and Affect: Mood normal.        Behavior: Behavior normal. Behavior is cooperative.        Thought Content: Thought content normal.        Judgment: Judgment normal.     Last CBC Lab Results  Component Value Date   WBC 12.0 (H) 08/12/2022   HGB 10.8 (L) 08/12/2022   HCT 32.4 (L) 08/12/2022   MCV 90.0 08/12/2022   MCH 30.0 08/12/2022   RDW 15.0 08/12/2022   PLT 263 08/12/2022   Last metabolic panel Lab Results  Component Value Date   GLUCOSE 72 07/23/2012   NA 139 07/23/2012   K 3.6 07/23/2012   CL 104 07/23/2012   CO2 28 07/23/2012   BUN 9 07/23/2012   CREATININE 0.7 07/23/2012   CALCIUM 9.1 07/23/2012   PROT 7.2 07/23/2012  ALBUMIN 4.1 07/23/2012   BILITOT 0.5 07/23/2012   ALKPHOS 34 (L) 07/23/2012   AST 24 07/23/2012   ALT 18 07/23/2012      Assessment & Plan:   Problem List Items Addressed This Visit       Respiratory   Exercise-induced asthma    Stable at this time.  No inhalers.  Will restart inhalers once she gets on more active schedule.        Other   Anxiety and depression - Primary    Chronic, stable.  Denies SI/HI.  Currently Wellbutrin offering benefit to mood, will continue this and takeover refills -- will refill upon request.  Continue monitor at visits.  She plans on restarting therapy in the summer.       Relevant Medications   buPROPion (WELLBUTRIN XL) 150 MG 24 hr tablet   Other Visit Diagnoses     Encounter to establish care       New patient to office, introduced to office setting and provider.       Return in about 2 months (around 06/01/2023) for Annual physical.   Marjie Skiff, NP

## 2023-03-26 NOTE — Assessment & Plan Note (Signed)
Stable at this time.  No inhalers.  Will restart inhalers once she gets on more active schedule.

## 2023-03-26 NOTE — Assessment & Plan Note (Signed)
Chronic, stable.  Denies SI/HI.  Currently Wellbutrin offering benefit to mood, will continue this and takeover refills -- will refill upon request.  Continue monitor at visits.  She plans on restarting therapy in the summer.

## 2023-04-09 ENCOUNTER — Ambulatory Visit: Payer: BC Managed Care – PPO | Admitting: Nurse Practitioner

## 2023-06-06 NOTE — Patient Instructions (Signed)
[place] coverage!!  Be Involved in Your Health Care:  Taking Medications When medications are taken as directed, they can greatly improve your health. But if they are not taken as instructed, they may not work. In some cases, not taking them correctly can be harmful. To help ensure your treatment remains effective and safe, understand your medications and how to take them.  Your lab results, notes and after visit summary will be available on My Chart. We strongly encourage you to use this feature. If lab results are abnormal the clinic will contact you with the appropriate steps. If the clinic does not contact you assume the results are satisfactory. You can always see your results on My Chart. If you have questions regarding your condition, please contact the clinic during office hours. You can also ask questions on My Chart.  We at [facility] are grateful that you chose us to provide care. We strive to provide excellent and compassionate care and are always looking for feedback. If you get a survey from the clinic please complete this.   Healthy Eating, Adult Healthy eating may help you get and keep a healthy body weight, reduce the risk of chronic disease, and live a long and productive life. It is important to follow a healthy eating pattern. Your nutritional and calorie needs should be met mainly by different nutrient-rich foods. What are tips for following this plan? Reading food labels Read labels and choose the following: Reduced or low sodium products. Juices with 100% fruit juice. Foods with low saturated fats (<3 g per serving) and high polyunsaturated and monounsaturated fats. Foods with whole grains, such as whole wheat, cracked wheat, brown rice, and wild rice. Whole grains that are fortified with folic acid. This is recommended for females who are pregnant or who want to become pregnant. Read labels and do not eat or drink the following: Foods or drinks with added  sugars. These include foods that contain brown sugar, corn sweetener, corn syrup, dextrose, fructose, glucose, high-fructose corn syrup, honey, invert sugar, lactose, malt syrup, maltose, molasses, raw sugar, sucrose, trehalose, or turbinado sugar. Limit your intake of added sugars to less than 10% of your total daily calories. Do not eat more than the following amounts of added sugar per day: 6 teaspoons (25 g) for females. 9 teaspoons (38 g) for males. Foods that contain processed or refined starches and grains. Refined grain products, such as white flour, degermed cornmeal, white bread, and white rice. Shopping Choose nutrient-rich snacks, such as vegetables, whole fruits, and nuts. Avoid high-calorie and high-sugar snacks, such as potato chips, fruit snacks, and candy. Use oil-based dressings and spreads on foods instead of solid fats such as butter, margarine, sour cream, or cream cheese. Limit pre-made sauces, mixes, and "instant" products such as flavored rice, instant noodles, and ready-made pasta. Try more plant-protein sources, such as tofu, tempeh, black beans, edamame, lentils, nuts, and seeds. Explore eating plans such as the Mediterranean diet or vegetarian diet. Try heart-healthy dips made with beans and healthy fats like hummus and guacamole. Vegetables go great with these. Cooking Use oil to saut or stir-fry foods instead of solid fats such as butter, margarine, or lard. Try baking, boiling, grilling, or broiling instead of frying. Remove the fatty part of meats before cooking. Steam vegetables in water or broth. Meal planning  At meals, imagine dividing your plate into fourths: One-half of your plate is fruits and vegetables. One-fourth of your plate is whole grains. One-fourth of your plate   is protein, especially lean meats, poultry, eggs, tofu, beans, or nuts. Include low-fat dairy as part of your daily diet. Lifestyle Choose healthy options in all settings, including  home, work, school, restaurants, or stores. Prepare your food safely: Wash your hands after handling raw meats. Where you prepare food, keep surfaces clean by regularly washing with hot, soapy water. Keep raw meats separate from ready-to-eat foods, such as fruits and vegetables. Cook seafood, meat, poultry, and eggs to the recommended temperature. Get a food thermometer. Store foods at safe temperatures. In general: Keep cold foods at 40F (4.4C) or below. Keep hot foods at 140F (60C) or above. Keep your freezer at 0F (-17.8C) or below. Foods are not safe to eat if they have been between the temperatures of 40-140F (4.4-60C) for more than 2 hours. What foods should I eat? Fruits Aim to eat 1-2 cups of fresh, canned (in natural juice), or frozen fruits each day. One cup of fruit equals 1 small apple, 1 large banana, 8 large strawberries, 1 cup (237 g) canned fruit,  cup (82 g) dried fruit, or 1 cup (240 mL) 100% juice. Vegetables Aim to eat 2-4 cups of fresh and frozen vegetables each day, including different varieties and colors. One cup of vegetables equals 1 cup (91 g) broccoli or cauliflower florets, 2 medium carrots, 2 cups (150 g) raw, leafy greens, 1 large tomato, 1 large bell pepper, 1 large sweet potato, or 1 medium white potato. Grains Aim to eat 5-10 ounce-equivalents of whole grains each day. Examples of 1 ounce-equivalent of grains include 1 slice of bread, 1 cup (40 g) ready-to-eat cereal, 3 cups (24 g) popcorn, or  cup (93 g) cooked rice. Meats and other proteins Try to eat 5-7 ounce-equivalents of protein each day. Examples of 1 ounce-equivalent of protein include 1 egg,  oz nuts (12 almonds, 24 pistachios, or 7 walnut halves), 1/4 cup (90 g) cooked beans, 6 tablespoons (90 g) hummus or 1 tablespoon (16 g) peanut butter. A cut of meat or fish that is the size of a deck of cards is about 3-4 ounce-equivalents (85 g). Of the protein you eat each week, try to have at least  8 sounce (227 g) of seafood. This is about 2 servings per week. This includes salmon, trout, herring, sardines, and anchovies. Dairy Aim to eat 3 cup-equivalents of fat-free or low-fat dairy each day. Examples of 1 cup-equivalent of dairy include 1 cup (240 mL) milk, 8 ounces (250 g) yogurt, 1 ounces (44 g) natural cheese, or 1 cup (240 mL) fortified soy milk. Fats and oils Aim for about 5 teaspoons (21 g) of fats and oils per day. Choose monounsaturated fats, such as canola and olive oils, mayonnaise made with olive oil or avocado oil, avocados, peanut butter, and most nuts, or polyunsaturated fats, such as sunflower, corn, and soybean oils, walnuts, pine nuts, sesame seeds, sunflower seeds, and flaxseed. Beverages Aim for 6 eight-ounce glasses of water per day. Limit coffee to 3-5 eight-ounce cups per day. Limit caffeinated beverages that have added calories, such as soda and energy drinks. If you drink alcohol: Limit how much you have to: 0-1 drink a day if you are female. 0-2 drinks a day if you are female. Know how much alcohol is in your drink. In the [place] one drink is one 12 oz bottle of beer (355 mL), one 5 oz glass of wine (148 mL), or one 1 oz glass of hard liquor (44 mL). Seasoning and other foods Try   not to add too much salt to your food. Try using herbs and spices instead of salt. Try not to add sugar to food. This information is based on [place] nutrition guidelines. To learn more, visit choosemyplate.gov. Exact amounts may vary. You may need different amounts. This information is not intended to replace advice given to you by your health care provider. Make sure you discuss any questions you have with your health care provider. Document Revised: 08/25/2022 Document Reviewed: 08/25/2022 Elsevier Patient Education  2024 [employer]  

## 2023-06-10 ENCOUNTER — Ambulatory Visit (INDEPENDENT_AMBULATORY_CARE_PROVIDER_SITE_OTHER): Payer: BC Managed Care – PPO | Admitting: Nurse Practitioner

## 2023-06-10 ENCOUNTER — Encounter: Payer: Self-pay | Admitting: Nurse Practitioner

## 2023-06-10 VITALS — BP 115/76 | HR 56 | Temp 98.0°F | Ht 68.11 in | Wt 191.8 lb

## 2023-06-10 DIAGNOSIS — M79674 Pain in right toe(s): Secondary | ICD-10-CM | POA: Insufficient documentation

## 2023-06-10 DIAGNOSIS — Z1159 Encounter for screening for other viral diseases: Secondary | ICD-10-CM

## 2023-06-10 DIAGNOSIS — Z13220 Encounter for screening for lipoid disorders: Secondary | ICD-10-CM

## 2023-06-10 DIAGNOSIS — E559 Vitamin D deficiency, unspecified: Secondary | ICD-10-CM

## 2023-06-10 DIAGNOSIS — Z0000 Encounter for general adult medical examination without abnormal findings: Secondary | ICD-10-CM

## 2023-06-10 DIAGNOSIS — Z136 Encounter for screening for cardiovascular disorders: Secondary | ICD-10-CM | POA: Diagnosis not present

## 2023-06-10 DIAGNOSIS — F32A Depression, unspecified: Secondary | ICD-10-CM

## 2023-06-10 DIAGNOSIS — J45990 Exercise induced bronchospasm: Secondary | ICD-10-CM

## 2023-06-10 DIAGNOSIS — F419 Anxiety disorder, unspecified: Secondary | ICD-10-CM | POA: Diagnosis not present

## 2023-06-10 DIAGNOSIS — N912 Amenorrhea, unspecified: Secondary | ICD-10-CM | POA: Diagnosis not present

## 2023-06-10 NOTE — Progress Notes (Signed)
BP 115/76   Pulse (!) 56   Temp 98 F (36.7 C) (Oral)   Ht 5' 8.11" (1.73 m)   Wt 191 lb 12.8 oz (87 kg)   LMP  (LMP Unknown)   SpO2 100%   BMI 29.07 kg/m    Subjective:    Patient ID: Elizabeth Harding, female    DOB: 08-24-1986, 37 y.o.   MRN: [id number]  HPI: Elizabeth Harding is a 37 y.o. female presenting on 06/10/2023 for comprehensive medical examination. Current medical complaints include: toe pain  She currently lives with: husband and children Menopausal Symptoms: no  Reports he asthma remains stable.  FOOT PAIN Having issues with pain to left great toe, present for one month. Duration: weeks Involved foot: left Mechanism of injury: unknown Location: left great toe Onset: sudden  Severity: 5/10  Quality:  dull, aching, and throbbing Frequency: constant Radiation: no Aggravating factors: weight bearing, walking, and movement  Alleviating factors: rest  Status: stable Treatments attempted: rest  Relief with NSAIDs?:  No NSAIDs Taken Weakness with weight bearing or walking: no Morning stiffness: yes Swelling: no Redness: no Bruising: no Paresthesias / decreased sensation: no  Fevers:no   DEPRESSION Currently takes Wellbutrin 150 XL daily.  She still has not started menstrual cycles since giving birth 10 months ago.  Her first child it did not return for 7 1/2 months. Mood status: stable Satisfied with current treatment?: yes Symptom severity: mild  Duration of current treatment : chronic Side effects: no Medication compliance: good compliance Psychotherapy/counseling: yes currently in therapy Depressed mood: no Anxious mood: yes Anhedonia: no Significant weight loss or gain: no Insomnia: none Fatigue: yes has 25 month old Feelings of worthlessness or guilt: no Impaired concentration/indecisiveness: no Suicidal ideations: no Hopelessness: no Crying spells: no    06/10/2023    9:28 AM 03/26/2023    8:30 AM 01/11/2021    8:56 AM 12/20/2020   10:12 AM   Depression screen PHQ 2/9  Decreased Interest 1 0 0 1  Down, Depressed, Hopeless 1 0 0 1  PHQ - 2 Score 2 0 0 2  Altered sleeping 0 0 0 0  Tired, decreased energy 3 2 0 2  Change in appetite 2 1 1 1   Feeling bad or failure about yourself  1 1 1 1   Trouble concentrating 0 0 0 0  Moving slowly or fidgety/restless 0 0 0 0  Suicidal thoughts 0 0 0 0  PHQ-9 Score 8 4 2 6   Difficult doing work/chores Not difficult at all Somewhat difficult Somewhat difficult Somewhat difficult       06/10/2023    9:28 AM 03/26/2023    8:31 AM 01/11/2021    1:26 PM 12/20/2020   10:19 AM  GAD 7 : Generalized Anxiety Score  Nervous, Anxious, on Edge 3 1 2 1   Control/stop worrying 1 1 0 1  Worry too much - different things 1 0 0 0  Trouble relaxing 2 1 2 2   Restless 0 0 1 1  Easily annoyed or irritable 3 2 3 3   Afraid - awful might happen 0 0 0 1  Total GAD 7 Score 10 5 8 9   Anxiety Difficulty Somewhat difficult Somewhat difficult  Very difficult      08/11/2022    7:40 PM 08/11/2022    7:55 PM 08/12/2022    8:00 AM 03/26/2023    8:30 AM 06/10/2023    9:27 AM  Fall Risk  Falls in the past year?  0 0  Was there an injury with Fall?    0 0  Fall Risk Category Calculator    0 0  (RETIRED) Patient Fall Risk Level Moderate fall risk Low fall risk Low fall risk    Patient at Risk for Falls Due to    No Fall Risks No Fall Risks  Fall risk Follow up    Falls evaluation completed Falls evaluation completed    Past Medical History:  Past Medical History:  Diagnosis Date   Allergic rhinitis due to pollen    hay fever   Allergy    Seasonal   Anxiety    with aggitation and anger   Asthma    Chicken pox    Depression 2015   GERD (gastroesophageal reflux disease)    During pregnancy   Irregular intermenstrual bleeding    Jaundice    as a newborn   Traumatic injury during pregnancy in third trimester 12/24/2015    Surgical History:  Past Surgical History:  Procedure Laterality Date   CERVICAL BIOPSY   W/ LOOP ELECTRODE EXCISION  08/09/2011   C[facility], [facility]   WISDOM TOOTH EXTRACTION      Medications:  Current Outpatient Medications on File Prior to Visit  Medication Sig   buPROPion (WELLBUTRIN XL) 150 MG 24 hr tablet Take 150 mg by mouth daily.   Prenatal Vit-Fe Fumarate-FA (PRENATAL MULTIVITAMIN) TABS tablet Take 1 tablet by mouth daily at 12 noon.   No current facility-administered medications on file prior to visit.    Allergies:  Allergies  Allergen Reactions   Sunscreens Rash    Social History:  Social History   Socioeconomic History   Marital status: Married    Spouse name: Not on file   Number of children: Not on file   Years of education: Not on file   Highest education level: Not on file  Occupational History   Not on file  Tobacco Use   Smoking status: Never   Smokeless tobacco: Never  Vaping Use   Vaping Use: Never used  Substance and Sexual Activity   Alcohol use: Yes    Alcohol/week: 1.0 standard drink of alcohol    Types: 1 Cans of beer per week    Comment: occas   Drug use: No   Sexual activity: Yes    Partners: Male    Birth control/protection: Condom, Surgical    Comment: vasectomy  Other Topics Concern   Not on file  Social History Narrative   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 - [place], with trainerDiet - regular   Social Determinants of Health   Financial Resource Strain: Low Risk  (03/26/2023)   Overall Financial Resource Strain (CARDIA)    Difficulty of Paying Living Expenses: Not hard at all  Food Insecurity: No Food Insecurity (03/26/2023)   Hunger Vital Sign    Worried About Running Out of Food in the Last Year: Never true    Ran Out of Food in the Last Year: Never true  Transportation Needs: No Transportation Needs (03/26/2023)   PRAPARE - Administrator, Civil Service (Medical): No    Lack of Transportation (Non-Medical): No  Physical Activity: Inactive (03/26/2023)   Exercise Vital Sign     Days of Exercise per Week: 0 days    Minutes of Exercise per Session: 0 min  Stress: No Stress Concern Present (03/26/2023)   [employer] of Occupational Health - Occupational Stress Questionnaire    Feeling of Stress :  Not at all  Social Connections: Moderately Integrated (03/26/2023)   Social Connection and Isolation Panel [NHANES]    Frequency of Communication with Friends and Family: More than three times a week    Frequency of Social Gatherings with Friends and Family: More than three times a week    Attends Religious Services: More than 4 times per year    Active Member of [employer] or Organizations: No    Attends Banker Meetings: Never    Marital Status: Married  Catering manager Violence: Not At Risk (03/26/2023)   Humiliation, Afraid, Rape, and Kick questionnaire    Fear of Current or Ex-Partner: No    Emotionally Abused: No    Physically Abused: No    Sexually Abused: No   Social History   Tobacco Use  Smoking Status Never  Smokeless Tobacco Never   Social History   Substance and Sexual Activity  Alcohol Use Yes   Alcohol/week: 1.0 standard drink of alcohol   Types: 1 Cans of beer per week   Comment: occas    Family History:  Family History  Problem Relation Age of Onset   Hypertension Father    Hyperlipidemia Sister    Rheum arthritis Maternal Grandmother    Osteoporosis Maternal Grandmother    Heart disease Paternal Grandmother    Rheum arthritis Paternal Grandmother    Heart disease Paternal Grandfather    Hypertension Other     Past medical history, surgical history, medications, allergies, family history and social history reviewed with patient today and changes made to appropriate areas of the chart.   ROS All other ROS negative except what is listed above and in the HPI.      Objective:    BP 115/76   Pulse (!) 56   Temp 98 F (36.7 C) (Oral)   Ht 5' 8.11" (1.73 m)   Wt 191 lb 12.8 oz (87 kg)   LMP  (LMP Unknown)   SpO2 100%    BMI 29.07 kg/m   Wt Readings from Last 3 Encounters:  06/10/23 191 lb 12.8 oz (87 kg)  03/26/23 191 lb 3.2 oz (86.7 kg)  08/11/22 202 lb (91.6 kg)    Physical Exam Vitals and nursing note reviewed. Exam conducted with a chaperone present.  Constitutional:      General: She is awake. She is not in acute distress.    Appearance: She is well-developed and well-groomed. She is not ill-appearing or toxic-appearing.  HENT:     Head: Normocephalic and atraumatic.     Right Ear: Hearing, tympanic membrane, ear canal and external ear normal. No drainage.     Left Ear: Hearing, tympanic membrane, ear canal and external ear normal. No drainage.     Nose: Nose normal.     Right Sinus: No maxillary sinus tenderness or frontal sinus tenderness.     Left Sinus: No maxillary sinus tenderness or frontal sinus tenderness.     Mouth/Throat:     Mouth: Mucous membranes are moist.     Pharynx: Oropharynx is clear. Uvula midline. No pharyngeal swelling, oropharyngeal exudate or posterior oropharyngeal erythema.  Eyes:     General: Lids are normal.        Right eye: No discharge.        Left eye: No discharge.     Extraocular Movements: Extraocular movements intact.     Conjunctiva/sclera: Conjunctivae normal.     Pupils: Pupils are equal, round, and reactive to light.     Visual  Fields: Right eye visual fields normal and left eye visual fields normal.  Neck:     Thyroid: No thyromegaly.     Vascular: No carotid bruit.     Trachea: Trachea normal.  Cardiovascular:     Rate and Rhythm: Regular rhythm. Bradycardia present.     Pulses:          Dorsalis pedis pulses are 2+ on the right side and 2+ on the left side.       Posterior tibial pulses are 2+ on the right side and 2+ on the left side.     Heart sounds: Normal heart sounds. No murmur heard.    No gallop.  Pulmonary:     Effort: Pulmonary effort is normal. No accessory muscle usage or respiratory distress.     Breath sounds: Normal breath  sounds.  Chest:     Comments: Deferred, is breast feeding. Abdominal:     General: Bowel sounds are normal.     Palpations: Abdomen is soft. There is no hepatomegaly or splenomegaly.     Tenderness: There is no abdominal tenderness.  Musculoskeletal:        General: Normal range of motion.     Cervical back: Normal range of motion and neck supple.     Right lower leg: No edema.     Left lower leg: No edema.     Right foot: Normal range of motion.     Left foot: Normal range of motion.  Feet:     Right foot:     Protective Sensation: 10 sites tested.  10 sites sensed.     Skin integrity: Skin integrity normal.     Toenail Condition: Right toenails are normal.     Left foot:     Protective Sensation: 10 sites tested.  10 sites sensed.     Skin integrity: Skin integrity normal.     Toenail Condition: Fungal disease present.    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  Right great toe with mild tenderness to dorsal aspect.  Mild discomfort with flexion and lateral movements.  No redness or warmth. Lymphadenopathy:     Head:     Right side of head: No submental, submandibular, tonsillar, preauricular or posterior auricular adenopathy.     Left side of head: No submental, submandibular, tonsillar, preauricular or posterior auricular adenopathy.     Cervical: No cervical adenopathy.  Skin:    General: Skin is warm and dry.     Capillary Refill: Capillary refill takes less than 2 seconds.     Findings: No rash.  Neurological:     Mental Status: She is alert and oriented to person, place, and time.     Gait: Gait is intact.     Deep Tendon Reflexes: Reflexes are normal and symmetric.     Reflex Scores:      Brachioradialis reflexes are 2+ on the right side and 2+ on the left side.      Patellar reflexes are 2+ on the right side and 2+ on the left side. Psychiatric:        Attention and Perception: Attention normal.        Mood and Affect: Mood normal.        Speech:  Speech normal.        Behavior: Behavior normal. Behavior is cooperative.        Thought Content: Thought content normal.        Judgment: Judgment normal.  Results for orders placed or performed in visit on 03/26/23  HM PAP SMEAR  Result Value Ref Range   HM Pap smear see report scanned into chart   Results Console HPV  Result Value Ref Range   CHL HPV Negative       Assessment & Plan:   Problem List Items Addressed This Visit       Respiratory   Exercise-induced asthma - Primary    Stable at this time.  No inhalers.  Will restart inhalers once she gets on more active schedule.  Labs today.      Relevant Orders   CBC with Differential/Platelet   Comprehensive metabolic panel     Other   Amenorrhea    Has not restarted cycle, infant is 52 months old.  Her first child cycle did not restart until 7 1/2 months. Will obtain labs today and pregnancy testing to further assess.      Relevant Orders   FSH/LH   Hormone Panel   Thyroid peroxidase antibody   T4, free   Pregnancy, urine   Anxiety and depression    Chronic, ongoing.  Denies SI/HI.  Currently Wellbutrin offering benefit to mood, will continue this and takeover refills -- will refill upon request.  Discussed addition of Buspar for anxiety element, as scores slightly higher today, she would like to hold off on this.  Consider addition in future.  Continue collaboration with therapy, which is offering benefit.        Relevant Orders   TSH   Great toe pain, right    Ongoing for one month.  Discussed with patient, will obtain imaging to further assess.  No recent trauma.  Check uric acid level.  Can take Tylenol as needed.  Use Icy/Hot is needed.      Relevant Orders   Uric acid   DG Foot Complete Right   Other Visit Diagnoses     Vitamin D deficiency       History of low levels reported, check today and start supplement as needed.   Relevant Orders   VITAMIN D 25 Hydroxy (Vit-D Deficiency, Fractures)    Encounter for lipid screening for cardiovascular disease       Lipid panel on labs today.   Relevant Orders   Lipid Panel w/o Chol/HDL Ratio   Need for hepatitis C screening test       Hep C screen on labs today per guidelines for one time screening, discussed with patient.   Relevant Orders   Hepatitis C antibody   Encounter for annual physical exam       Annual physical today with labs and health maintenance reviewed, discussed with patient.        Follow up plan: Return in about 6 months (around 12/11/2023) for DEPRESSION AND ANXIETY.   LABORATORY TESTING:  - Pap smear: up to date  IMMUNIZATIONS:   - Tdap: Tetanus vaccination status reviewed: last tetanus booster within 10 years. - Influenza: Up to date - Pneumovax: Not applicable - Prevnar: Not applicable - COVID: Up to date - HPV: Not applicable - Shingrix vaccine: Not applicable  SCREENING: -Mammogram: Not applicable  - Colonoscopy: Not applicable  - Bone Density: Not applicable  -Hearing Test: Not applicable  -Spirometry: Not applicable   PATIENT COUNSELING:   Advised to take 1 mg of folate supplement per day if capable of pregnancy.   Sexuality: Discussed sexually transmitted diseases, partner selection, use of condoms, avoidance of unintended pregnancy  and contraceptive alternatives.  Advised to avoid cigarette smoking.  I discussed with the patient that most people either abstain from alcohol or drink within safe limits (<=14/week and <=4 drinks/occasion for males, <=7/weeks and <= 3 drinks/occasion for females) and that the risk for alcohol disorders and other health effects rises proportionally with the number of drinks per week and how often a drinker exceeds daily limits.  Discussed cessation/primary prevention of drug use and availability of treatment for abuse.   Diet: Encouraged to adjust caloric intake to maintain  or achieve ideal body weight, to reduce intake of dietary saturated fat and total fat,  to limit sodium intake by avoiding high sodium foods and not adding table salt, and to maintain adequate dietary potassium and calcium preferably from fresh fruits, vegetables, and low-fat dairy products.    Stressed the importance of regular exercise  Injury prevention: Discussed safety belts, safety helmets, smoke detector, smoking near bedding or upholstery.   Dental health: Discussed importance of regular tooth brushing, flossing, and dental visits.    NEXT PREVENTATIVE PHYSICAL DUE IN 1 YEAR. Return in about 6 months (around 12/11/2023) for DEPRESSION AND ANXIETY.

## 2023-06-10 NOTE — Assessment & Plan Note (Signed)
Ongoing for one month.  Discussed with patient, will obtain imaging to further assess.  No recent trauma.  Check uric acid level.  Can take Tylenol as needed.  Use Icy/Hot is needed.

## 2023-06-10 NOTE — Assessment & Plan Note (Signed)
Chronic, ongoing.  Denies SI/HI.  Currently Wellbutrin offering benefit to mood, will continue this and takeover refills -- will refill upon request.  Discussed addition of Buspar for anxiety element, as scores slightly higher today, she would like to hold off on this.  Consider addition in future.  Continue collaboration with therapy, which is offering benefit.

## 2023-06-10 NOTE — Assessment & Plan Note (Signed)
Has not restarted cycle, infant is 66 months old.  Her first child cycle did not restart until 7 1/2 months. Will obtain labs today and pregnancy testing to further assess.

## 2023-06-10 NOTE — Assessment & Plan Note (Signed)
Stable at this time.  No inhalers.  Will restart inhalers once she gets on more active schedule.  Labs today.

## 2023-06-11 NOTE — Progress Notes (Signed)
Contacted via MyChart   Good morning Elizabeth Harding, some of your labs have returned, waiting on a few more: - Vitamin D remains a little low, please ensure you are taking Vitamin D3 2000 units daily. - CBC shows no anemia or infection. - Kidney function, creatinine and eGFR, remains normal, as is liver function, AST and ALT.  - Cholesterol levels are stable, great job!! - Based on FSH/LH, it appears you may be in ovulation phase.  However, I would like a pregnancy test as well to check on this. - Thyroid labs are all normal and uric acid level is normal.  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   - Waiting on the rest of hormone levels.  Any questions? Keep being amazing!!  Thank you for allowing me to participate in your care.  I appreciate you. Kindest regards, Larcenia Holaday

## 2023-06-11 NOTE — Progress Notes (Signed)
Contacted on MyChart, but not sure she checks this on review, please call: Good morning Elizabeth Harding, some of your labs have returned, waiting on a few more: - Vitamin D remains a little low, please ensure you are taking Vitamin D3 2000 units daily. - CBC shows no anemia or infection. - Kidney function, creatinine and eGFR, remains normal, as is liver function, AST and ALT. - Cholesterol levels are stable, great job!! - Based on FSH/LH, it appears you may be in ovulation phase.  However, I would like a pregnancy test as well to check on this. - Thyroid labs are all normal and uric acid level is normal.  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   - Waiting on the rest of hormone levels.  Any questions? Keep being amazing!!  Thank you for allowing me to participate in your care.  I appreciate you. Kindest regards, Jahree Dermody

## 2023-06-12 LAB — PREGNANCY, URINE: Preg Test, Ur: NEGATIVE

## 2023-06-12 NOTE — Addendum Note (Signed)
Addended by: Aura Dials T on: 06/12/2023 08:47 AM   Modules accepted: Orders

## 2023-06-12 NOTE — Progress Notes (Signed)
Contacted via MyChart   Pregnancy testing negative:)

## 2023-06-20 NOTE — Progress Notes (Signed)
Contacted via MyChart   Two more hormone levels pending, but overall at present current levels are all normal.

## 2023-06-22 LAB — CBC WITH DIFFERENTIAL/PLATELET
Basophils Absolute: 0 10*3/uL (ref 0.0–0.2)
Basos: 1 %
EOS (ABSOLUTE): 0.2 10*3/uL (ref 0.0–0.4)
Eos: 3 %
Hematocrit: 39.3 % (ref 34.0–46.6)
Hemoglobin: 13 g/dL (ref 11.1–15.9)
Immature Grans (Abs): 0 10*3/uL (ref 0.0–0.1)
Immature Granulocytes: 0 %
Lymphocytes Absolute: 1.6 10*3/uL (ref 0.7–3.1)
Lymphs: 24 %
MCH: 29.6 pg (ref 26.6–33.0)
MCHC: 33.1 g/dL (ref 31.5–35.7)
MCV: 90 fL (ref 79–97)
Monocytes Absolute: 0.4 10*3/uL (ref 0.1–0.9)
Monocytes: 7 %
Neutrophils Absolute: 4.2 10*3/uL (ref 1.4–7.0)
Neutrophils: 65 %
Platelets: 286 10*3/uL (ref 150–450)
RBC: 4.39 x10E6/uL (ref 3.77–5.28)
RDW: 12.3 % (ref 11.7–15.4)
WBC: 6.4 10*3/uL (ref 3.4–10.8)

## 2023-06-22 LAB — COMPREHENSIVE METABOLIC PANEL
ALT: 13 IU/L (ref 0–32)
AST: 19 IU/L (ref 0–40)
Albumin: 4.8 g/dL (ref 3.9–4.9)
Alkaline Phosphatase: 114 IU/L (ref 44–121)
BUN/Creatinine Ratio: 11 (ref 9–23)
BUN: 10 mg/dL (ref 6–20)
Bilirubin Total: 0.4 mg/dL (ref 0.0–1.2)
CO2: 26 mmol/L (ref 20–29)
Calcium: 9.6 mg/dL (ref 8.7–10.2)
Chloride: 102 mmol/L (ref 96–106)
Creatinine, Ser: 0.87 mg/dL (ref 0.57–1.00)
Globulin, Total: 2.3 g/dL (ref 1.5–4.5)
Glucose: 74 mg/dL (ref 70–99)
Potassium: 4.3 mmol/L (ref 3.5–5.2)
Sodium: 141 mmol/L (ref 134–144)
Total Protein: 7.1 g/dL (ref 6.0–8.5)
eGFR: 88 mL/min/{1.73_m2} (ref >59)

## 2023-06-22 LAB — LIPID PANEL W/O CHOL/HDL RATIO
Cholesterol, Total: 176 mg/dL (ref 100–199)
HDL: 65 mg/dL (ref >39)
LDL Chol Calc (NIH): 99 mg/dL (ref 0–99)
Triglycerides: 60 mg/dL (ref 0–149)
VLDL Cholesterol Cal: 12 mg/dL (ref 5–40)

## 2023-06-22 LAB — FSH/LH
FSH: 9.1 m[IU]/mL
LH: 12.8 m[IU]/mL

## 2023-06-22 LAB — HORMONE PANEL (T4,TSH,FSH,TESTT,SHBG,DHEA,ETC)
DHEA-Sulfate, LCMS: 168 ug/dL
Estradiol, Serum, MS: 28 pg/mL
Estrone Sulfate: 33 ng/dL
Follicle Stimulating Hormone: 11 m[IU]/mL
Free T-3: 3.5 pg/mL
Free Testosterone, Serum: 2.2 pg/mL
Progesterone, Serum: <10 ng/dL
Sex Hormone Binding Globulin: 63.8 nmol/L
T4: 5.9 ug/dL
TSH: 1.2 uU/mL
Testosterone, Serum (Total): 22 ng/dL
Testosterone-% Free: 1 %
Triiodothyronine (T-3), Serum: 102 ng/dL

## 2023-06-22 LAB — T4, FREE: Free T4: 1.16 ng/dL (ref 0.82–1.77)

## 2023-06-22 LAB — THYROID PEROXIDASE ANTIBODY: Thyroperoxidase Ab SerPl-aCnc: <9 IU/mL (ref 0–34)

## 2023-06-22 LAB — HEPATITIS C ANTIBODY: Hep C Virus Ab: NONREACTIVE

## 2023-06-22 LAB — URIC ACID: Uric Acid: 4.6 mg/dL (ref 2.6–6.2)

## 2023-06-22 LAB — VITAMIN D 25 HYDROXY (VIT D DEFICIENCY, FRACTURES): Vit D, 25-Hydroxy: 29.9 ng/mL — ABNORMAL LOW (ref 30.0–100.0)

## 2023-06-22 LAB — TSH: TSH: 1.05 u[IU]/mL (ref 0.450–4.500)

## 2023-06-22 NOTE — Progress Notes (Signed)
Contacted via MyChart   All hormone levels stable.  I recommend a visit with GYN if no cycle over next few months.

## 2023-06-25 ENCOUNTER — Ambulatory Visit
Admission: RE | Admit: 2023-06-25 | Discharge: 2023-06-25 | Disposition: A | Discharge status: Home / Self Care | Payer: BC Managed Care – PPO | Attending: Nurse Practitioner | Admitting: Nurse Practitioner

## 2023-06-25 ENCOUNTER — Ambulatory Visit
Admission: RE | Admit: 2023-06-25 | Discharge: 2023-06-25 | Disposition: A | Discharge status: Home / Self Care | Payer: BC Managed Care – PPO | Source: Ambulatory Visit | Attending: Nurse Practitioner | Admitting: Nurse Practitioner

## 2023-06-25 DIAGNOSIS — M79674 Pain in right toe(s): Secondary | ICD-10-CM | POA: Diagnosis not present

## 2023-06-29 NOTE — Progress Notes (Signed)
Contacted via MyChart   Overall your foot imaging shows no signs of fracture of arthritic changes to great toe.  If pain continues after simple at home treatment we may need to consider a visit with podiatry.

## 2023-08-17 ENCOUNTER — Encounter: Payer: Self-pay | Admitting: Nurse Practitioner

## 2023-08-17 NOTE — Telephone Encounter (Signed)
Called and scheduled appointment on 08/18/2023 @ 11:00 am.

## 2023-08-18 ENCOUNTER — Encounter: Payer: Self-pay | Admitting: Nurse Practitioner

## 2023-08-18 ENCOUNTER — Ambulatory Visit: Payer: BC Managed Care – PPO | Admitting: Nurse Practitioner

## 2023-08-18 VITALS — BP 121/76 | HR 82 | Temp 98.4°F | Wt 195.0 lb

## 2023-08-18 DIAGNOSIS — R052 Subacute cough: Secondary | ICD-10-CM

## 2023-08-18 LAB — VERITOR FLU A/B WAIVED
Influenza A: NEGATIVE
Influenza B: NEGATIVE

## 2023-08-18 MED ORDER — AMOXICILLIN-POT CLAVULANATE 875-125 MG PO TABS: 1.0000 | ORAL_TABLET | Freq: Two times a day (BID) | ORAL | 0 refills | Status: AC

## 2023-08-18 MED ORDER — PREDNISONE 20 MG PO TABS: 40.0000 mg | ORAL_TABLET | Freq: Every day | ORAL | 0 refills | Status: AC

## 2023-08-18 NOTE — Progress Notes (Signed)
BP 121/76   Pulse 82   Temp 98.4 F (36.9 C) (Oral)   Wt 195 lb (88.5 kg)   LMP 07/24/2023 (Exact Date)   SpO2 99%   Breastfeeding Yes   BMI 29.55 kg/m    Subjective:    Patient ID: Elizabeth Harding, female    DOB: [birth year], 37 y.o.   MRN: [id number]  HPI: Elizabeth Harding is a 37 y.o. female  Chief Complaint  Patient presents with   URI    Pt states she has been having a cough, congestion, headache, and drainage for the past 2 to 3 weeks. States she has had 3 negative at home covid tests but states that she found out this morning that one of her coworkers tested positive    UPPER RESPIRATORY TRACT INFECTION She started symptoms August 23rd.   Did 3 Covid tests at home that were negative.  Found out this morning a coworker has Covid and she was exposed. Fever: no Cough: yes Shortness of breath: no Wheezing: no Chest pain: no Chest tightness: no Chest congestion: yes Nasal congestion: yes Runny nose: yes Post nasal drip: yes Sneezing: no Sore throat: yes off and on Swollen glands: no Sinus pressure: yes Headache: yes Face pain: no Toothache: no Ear pain: none Ear pressure: yes bilateral Eyes red/itching:no Eye drainage/crusting: no  Vomiting:  one time Rash: no Fatigue: yes Sick contacts: yes Strep contacts: no  Context: fluctuating Recurrent sinusitis: no Relief with OTC cold/cough medications: no  Treatments attempted:  Mucinex and Delsym  Relevant past medical, surgical, family and social history reviewed and updated as indicated. Interim medical history since our last visit reviewed. Allergies and medications reviewed and updated.  Review of Systems  Constitutional:  Positive for fatigue. Negative for activity change, appetite change, chills and fever.  HENT:  Positive for congestion, postnasal drip, rhinorrhea, sinus pressure and sore throat. Negative for ear discharge, ear pain, facial swelling, sinus pain, sneezing and voice change.   Eyes:   Negative for pain and visual disturbance.  Respiratory:  Positive for cough. Negative for chest tightness, shortness of breath and wheezing.   Cardiovascular:  Negative for chest pain, palpitations and leg swelling.  Gastrointestinal: Negative.   Musculoskeletal:  Positive for myalgias.  Neurological:  Positive for headaches. Negative for dizziness and numbness.  Psychiatric/Behavioral: Negative.      Per HPI unless specifically indicated above     Objective:    BP 121/76   Pulse 82   Temp 98.4 F (36.9 C) (Oral)   Wt 195 lb (88.5 kg)   LMP 07/24/2023 (Exact Date)   SpO2 99%   Breastfeeding Yes   BMI 29.55 kg/m   Wt Readings from Last 3 Encounters:  08/18/23 195 lb (88.5 kg)  06/10/23 191 lb 12.8 oz (87 kg)  03/26/23 191 lb 3.2 oz (86.7 kg)    Physical Exam Vitals and nursing note reviewed.  Constitutional:      General: She is awake. She is not in acute distress.    Appearance: She is well-developed and well-groomed. She is obese. She is ill-appearing. She is not toxic-appearing.  HENT:     Head: Normocephalic.     Right Ear: Hearing, ear canal and external ear normal. A middle ear effusion is present. Tympanic membrane is not injected or perforated.     Left Ear: Hearing, ear canal and external ear normal. A middle ear effusion is present. Tympanic membrane is not injected or perforated.     Nose: Rhinorrhea  present. Rhinorrhea is clear.     Right Sinus: No maxillary sinus tenderness or frontal sinus tenderness.     Left Sinus: No maxillary sinus tenderness or frontal sinus tenderness.     Mouth/Throat:     Mouth: Mucous membranes are moist.     Pharynx: Posterior oropharyngeal erythema (mild) present. No pharyngeal swelling or oropharyngeal exudate.  Eyes:     General: Lids are normal.        Right eye: No discharge.        Left eye: No discharge.     Conjunctiva/sclera: Conjunctivae normal.     Pupils: Pupils are equal, round, and reactive to light.  Neck:      Thyroid: No thyromegaly.     Vascular: No carotid bruit.  Cardiovascular:     Rate and Rhythm: Normal rate and regular rhythm.     Heart sounds: Normal heart sounds. No murmur heard.    No gallop.  Pulmonary:     Effort: Pulmonary effort is normal. No accessory muscle usage or respiratory distress.     Breath sounds: Normal breath sounds. No decreased breath sounds, wheezing or rhonchi.  Abdominal:     General: Bowel sounds are normal.     Palpations: Abdomen is soft. There is no hepatomegaly or splenomegaly.  Musculoskeletal:     Cervical back: Normal range of motion and neck supple.     Right lower leg: No edema.     Left lower leg: No edema.  Lymphadenopathy:     Head:     Right side of head: No submental, submandibular, tonsillar, preauricular or posterior auricular adenopathy.     Left side of head: No submental, submandibular, tonsillar, preauricular or posterior auricular adenopathy.     Cervical: No cervical adenopathy.  Skin:    General: Skin is warm and dry.  Neurological:     Mental Status: She is alert and oriented to person, place, and time.  Psychiatric:        Attention and Perception: Attention normal.        Mood and Affect: Mood normal.        Speech: Speech normal.        Behavior: Behavior normal. Behavior is cooperative.        Thought Content: Thought content normal.     Results for orders placed or performed in visit on 06/10/23  CBC with Differential/Platelet  Result Value Ref Range   WBC 6.4 3.4 - 10.8 x10E3/uL   RBC 4.39 3.77 - 5.28 x10E6/uL   Hemoglobin 13.0 11.1 - 15.9 g/dL   Hematocrit 16.1 09.6 - 46.6 %   MCV 90 79 - 97 fL   MCH 29.6 26.6 - 33.0 pg   MCHC 33.1 31.5 - 35.7 g/dL   RDW 04.5 40.9 - 81.1 %   Platelets 286 150 - 450 x10E3/uL   Neutrophils 65 Not Estab. %   Lymphs 24 Not Estab. %   Monocytes 7 Not Estab. %   Eos 3 Not Estab. %   Basos 1 Not Estab. %   Neutrophils Absolute 4.2 1.4 - 7.0 x10E3/uL   Lymphocytes Absolute 1.6 0.7  - 3.1 x10E3/uL   Monocytes Absolute 0.4 0.1 - 0.9 x10E3/uL   EOS (ABSOLUTE) 0.2 0.0 - 0.4 x10E3/uL   Basophils Absolute 0.0 0.0 - 0.2 x10E3/uL   Immature Granulocytes 0 Not Estab. %   Immature Grans (Abs) 0.0 0.0 - 0.1 x10E3/uL  Comprehensive metabolic panel  Result Value Ref Range   Glucose  74 70 - 99 mg/dL   BUN 10 6 - 20 mg/dL   Creatinine, Ser 6.57 0.57 - 1.00 mg/dL   eGFR 88 >84 ON/GEX/5.28   BUN/Creatinine Ratio 11 9 - 23   Sodium 141 134 - 144 mmol/L   Potassium 4.3 3.5 - 5.2 mmol/L   Chloride 102 96 - 106 mmol/L   CO2 26 20 - 29 mmol/L   Calcium 9.6 8.7 - 10.2 mg/dL   Total Protein 7.1 6.0 - 8.5 g/dL   Albumin 4.8 3.9 - 4.9 g/dL   Globulin, Total 2.3 1.5 - 4.5 g/dL   Bilirubin Total 0.4 0.0 - 1.2 mg/dL   Alkaline Phosphatase 114 44 - 121 IU/L   AST 19 0 - 40 IU/L   ALT 13 0 - 32 IU/L  Lipid Panel w/o Chol/HDL Ratio  Result Value Ref Range   Cholesterol, Total 176 100 - 199 mg/dL   Triglycerides 60 0 - 149 mg/dL   HDL 65 >41 mg/dL   VLDL Cholesterol Cal 12 5 - 40 mg/dL   LDL Chol Calc (NIH) 99 0 - 99 mg/dL  TSH  Result Value Ref Range   TSH 1.050 0.450 - 4.500 uIU/mL  VITAMIN D 25 Hydroxy (Vit-D Deficiency, Fractures)  Result Value Ref Range   Vit D, 25-Hydroxy 29.9 (L) 30.0 - 100.0 ng/mL  Hepatitis C antibody  Result Value Ref Range   Hep C Virus Ab Non Reactive Non Reactive  FSH/LH  Result Value Ref Range   LH 12.8 mIU/mL   FSH 9.1 mIU/mL  Hormone Panel  Result Value Ref Range   TSH 1.2 uU/mL   T4 5.9 ug/dL   Free T-3 3.5 pg/mL   Triiodothyronine (T-3), Serum 102 ng/dL   Testosterone, Serum (Total) 22 ng/dL   Free Testosterone, Serum 2.2 pg/mL   Testosterone-% Free 1.0 %   Follicle Stimulating Hormone 11 mIU/mL   Progesterone, Serum <10 ng/dL   DHEA-Sulfate, LCMS 324 ug/dL   Sex Hormone Binding Globulin 63.8 nmol/L   Estrone Sulfate 33 ng/dL   Estradiol, Serum, MS 28 pg/mL  Thyroid peroxidase antibody  Result Value Ref Range   Thyroperoxidase Ab  SerPl-aCnc <9 0 - 34 IU/mL  T4, free  Result Value Ref Range   Free T4 1.16 0.82 - 1.77 ng/dL  Uric acid  Result Value Ref Range   Uric Acid 4.6 2.6 - 6.2 mg/dL  Pregnancy, urine  Result Value Ref Range   Preg Test, Ur Negative Negative      Assessment & Plan:   Problem List Items Addressed This Visit       Other   Subacute cough - Primary    Ongoing since end of August.  Negative Covid tests at home, but had recent exposure -- perform Covid and flu testing today.  Due to length of ongoing cough will start Augmentin BID for 7 days and Prednisone 40 MG for 5 days.  Recommend: - Increased rest - Increasing Fluids - Acetaminophen as needed for fever/pain.  - Salt water gargling, chloraseptic spray and throat lozenges - Mucinex.  - Humidifying the air.       Relevant Orders   Novel Coronavirus, NAA ([facility])   Veritor Flu A/B Waived     Follow up plan: Return if symptoms worsen or fail to improve.

## 2023-08-18 NOTE — Patient Instructions (Signed)

## 2023-08-18 NOTE — Progress Notes (Signed)
Contacted via MyChart   Flu testing returned negative.  Great news!!  FYI for Augmentin: it is considered compatible with breastfeeding when used in usual recommended doses.  I know you are breast feeding and forgot to mention this.  Any questions?   Keep being incredible!!  Thank you for allowing me to participate in your care.  I appreciate you. Kindest regards, Jasmeen Fritsch

## 2023-08-18 NOTE — Assessment & Plan Note (Signed)
Ongoing since end of August.  Negative Covid tests at home, but had recent exposure -- perform Covid and flu testing today.  Due to length of ongoing cough will start Augmentin BID for 7 days and Prednisone 40 MG for 5 days.  Recommend: - Increased rest - Increasing Fluids - Acetaminophen as needed for fever/pain.  - Salt water gargling, chloraseptic spray and throat lozenges - Mucinex.  - Humidifying the air.

## 2023-08-20 LAB — NOVEL CORONAVIRUS, NAA: SARS-CoV-2, NAA: NOT DETECTED

## 2023-08-20 NOTE — Progress Notes (Signed)
Contacted via MyChart   No Covid!!!

## 2023-08-27 ENCOUNTER — Encounter: Payer: Self-pay | Admitting: Nurse Practitioner

## 2023-08-28 MED ORDER — BUPROPION HCL ER (XL) 150 MG PO TB24: 150.0000 mg | ORAL_TABLET | Freq: Every day | ORAL | 4 refills | Status: DC

## 2023-10-13 ENCOUNTER — Ambulatory Visit: Payer: Self-pay

## 2023-10-14 ENCOUNTER — Encounter: Payer: Self-pay | Admitting: Nurse Practitioner

## 2023-11-26 ENCOUNTER — Encounter: Payer: Self-pay | Admitting: Nurse Practitioner

## 2023-12-11 ENCOUNTER — Ambulatory Visit: Payer: 59 | Admitting: Nurse Practitioner

## 2023-12-11 DIAGNOSIS — N920 Excessive and frequent menstruation with regular cycle: Secondary | ICD-10-CM

## 2023-12-11 DIAGNOSIS — Z23 Encounter for immunization: Secondary | ICD-10-CM

## 2023-12-11 DIAGNOSIS — F32A Anxiety disorder, unspecified: Secondary | ICD-10-CM

## 2023-12-30 ENCOUNTER — Ambulatory Visit: Payer: 59 | Admitting: Nurse Practitioner

## 2024-01-02 NOTE — Patient Instructions (Signed)
Be Involved in Caring For Your Health:  Taking Medications When medications are taken as directed, they can greatly improve your health. But if they are not taken as prescribed, they may not work. In some cases, not taking them correctly can be harmful. To help ensure your treatment remains effective and safe, understand your medications and how to take them. Bring your medications to each visit for review by your provider.  Your lab results, notes, and after visit summary will be available on My Chart. We strongly encourage you to use this feature. If lab results are abnormal the clinic will contact you with the appropriate steps. If the clinic does not contact you assume the results are satisfactory. You can always view your results on My Chart. If you have questions regarding your health or results, please contact the clinic during office hours. You can also ask questions on My Chart.  We at [facility] are grateful that you chose [place] to provide your care. We strive to provide evidence-based and compassionate care and are always looking for feedback. If you get a survey from the clinic please complete this so we can hear your opinions.  Managing Depression, Adult Depression is a mental health condition that affects your thoughts, feelings, and actions. Being diagnosed with depression can bring you relief if you did not know why you have felt or behaved a certain way. It could also leave you feeling overwhelmed. Finding ways to manage your symptoms can help you feel more positive about your future. How to manage lifestyle changes Being depressed is difficult. Depression can increase the level of everyday stress. Stress can make depression symptoms worse. You may believe your symptoms cannot be managed or will never improve. However, there are many things you can try to help manage your symptoms. There is hope. Managing stress  Stress is your body's reaction to life changes and events,  both good and bad. Stress can add to your feelings of depression. Learning to manage your stress can help lessen your feelings of depression. Try some of the following approaches to reducing your stress (stress reduction techniques): Listen to music that you enjoy and that inspires you. Try using a meditation app or take a meditation class. Develop a practice that helps you connect with your spiritual self. Walk in nature, pray, or go to a place of worship. Practice deep breathing. To do this, inhale slowly through your nose. Pause at the top of your inhale for a few seconds and then exhale slowly, letting yourself relax. Repeat this three or four times. Practice yoga to help relax and work your muscles. Choose a stress reduction technique that works for you. These techniques take time and practice to develop. Set aside 5-15 minutes a day to do them. Therapists can offer training in these techniques. Do these things to help manage stress: Keep a journal. Know your limits. Set healthy boundaries for yourself and others, such as saying "no" when you think something is too much. Pay attention to how you react to certain situations. You may not be able to control everything, but you can change your reaction. Add humor to your life by watching funny movies or shows. Make time for activities that you enjoy and that relax you. Spend less time using electronics, especially at night before bed. The light from screens can make your brain think it is time to get up rather than go to bed.  Medicines Medicines, such as antidepressants, are often a part of  treatment for depression. Talk with your pharmacist or health care provider about all the medicines, supplements, and herbal products that you take, their possible side effects, and what medicines and other products are safe to take together. Make sure to report any side effects you may have to your health care provider. Relationships Your health care  provider may suggest family therapy, couples therapy, or individual therapy as part of your treatment. How to recognize changes Everyone responds differently to treatment for depression. As you recover from depression, you may start to: Have more interest in doing activities. Feel more hopeful. Have more energy. Eat a more regular amount of food. Have better mental focus. It is important to recognize if your depression is not getting better or is getting worse. The symptoms you had in the beginning may return, such as: Feeling tired. Eating too much or too little. Sleeping too much or too little. Feeling restless, agitated, or hopeless. Trouble focusing or making decisions. Having unexplained aches and pains. Feeling irritable, angry, or aggressive. If you or your family members notice these symptoms coming back, let your health care provider know right away. Follow these instructions at home: Activity Try to get some form of exercise each day, such as walking. Try yoga, mindfulness, or other stress reduction techniques. Participate in group activities if you are able. Lifestyle Get enough sleep. Cut down on or stop using caffeine, tobacco, alcohol, and any other harmful substances. Eat a healthy diet that includes plenty of vegetables, fruits, whole grains, low-fat dairy products, and lean protein. Limit foods that are high in solid fats, added sugar, or salt (sodium). General instructions Take over-the-counter and prescription medicines only as told by your health care provider. Keep all follow-up visits. It is important for your health care provider to check on your mood, behavior, and medicines. Your health care provider may need to make changes to your treatment. Where to find support Talking to others  Friends and family members can be sources of support and guidance. Talk to trusted friends or family members about your condition. Explain your symptoms and let them know that you  are working with a health care provider to treat your depression. Tell friends and family how they can help. Finances Find mental health providers that fit with your financial situation. Talk with your health care provider if you are worried about access to food, housing, or medicine. Call your insurance company to learn about your co-pays and prescription plan. Where to find more information You can find support in your area from: Anxiety and Depression Association of America (ADAA): adaa.org Mental Health [place]: mentalhealthamerica.net [employer] on Mental Illness: nami.org Contact a health care provider if: You stop taking your antidepressant medicines, and you have any of these symptoms: Nausea. Headache. Light-headedness. Chills and body aches. Not being able to sleep (insomnia). You or your friends and family think your depression is getting worse. Get help right away if: You have thoughts of hurting yourself or others. Get help right away if you feel like you may hurt yourself or others, or have thoughts about taking your own life. Go to your nearest emergency room or: Call 911. Call the National Suicide Prevention Lifeline at 315-804-2514 or 988. This is open 24 hours a day. Text the Crisis Text Line at 902-341-0291. This information is not intended to replace advice given to you by your health care provider. Make sure you discuss any questions you have with your health care provider. Document Revised: 04/01/2022 Document Reviewed:  04/01/2022 Elsevier Patient Education  2024 [employer].

## 2024-01-05 ENCOUNTER — Encounter: Payer: Self-pay | Admitting: Nurse Practitioner

## 2024-01-05 ENCOUNTER — Ambulatory Visit: Payer: 59 | Admitting: Nurse Practitioner

## 2024-01-05 VITALS — BP 124/80 | HR 65 | Temp 98.6°F | Ht 68.0 in | Wt 198.2 lb

## 2024-01-05 DIAGNOSIS — F32A Depression, unspecified: Secondary | ICD-10-CM

## 2024-01-05 DIAGNOSIS — F419 Anxiety disorder, unspecified: Secondary | ICD-10-CM

## 2024-01-05 DIAGNOSIS — Z23 Encounter for immunization: Secondary | ICD-10-CM

## 2024-01-05 DIAGNOSIS — N920 Excessive and frequent menstruation with regular cycle: Secondary | ICD-10-CM | POA: Diagnosis not present

## 2024-01-05 MED ORDER — BUPROPION HCL ER (SR) 100 MG PO TB12: 100.0000 mg | ORAL_TABLET | Freq: Every day | ORAL | 4 refills | Status: DC

## 2024-01-05 MED ORDER — NORETHINDRONE 0.35 MG PO TABS: 1.0000 | ORAL_TABLET | Freq: Every day | ORAL | 11 refills | Status: DC

## 2024-01-05 NOTE — Assessment & Plan Note (Signed)
Chronic, ongoing.  Denies SI/HI.  Some anhedonia present with 150 MG XL Wellbutrin, although not as much as she had with Lexapro.  Will change Wellbutrin to SR 100 MG daily dosing to see if less anhedonia, but still benefit to mood.  Discussed with patient, she thinks this was same as what was taken in past.  Educated on dose change.  Scores are improving significantly with Wellbutrin.

## 2024-01-05 NOTE — Progress Notes (Signed)
BP 124/80   Pulse 65   Temp 98.6 F (37 C) (Oral)   Ht 5\' 8"  (1.727 m)   Wt 198 lb 3.2 oz (89.9 kg)   SpO2 98%   BMI 30.14 kg/m    Subjective:    Patient ID: Elizabeth Harding, female    DOB: 1986-07-22, 38 y.o.   MRN: [id number]  HPI: Elizabeth Harding is a 38 y.o. female  Chief Complaint  Patient presents with   Anxiety   Depression   ABNORMAL MENSTRUAL CYCLES Heavier menstrual cycles started a couple months after recent infant was born.  Previously her cycles were shorter and lighter. Mainly uses pads at present, on heavy days changes these every 2 hours.  Has heavy days for two days, occasional clots.  Cramps have been less then previously.  During her cycles she is more snappy.  Fatigue due to 4 children. Took Minipill in past with benefit.  IUD was not tolerated. Currently on menstrual cycle.  Patient wishes to start  birth control.  No history of DVT/PE, migraine with aura, cancer, blood clotting disorder or tobacco use. [id number] Duration: months Average interval between menses: 28-31 days Length of menses: 4-5 days Flow: heavy initially, then lighter Dysmenorrhea: no Intermenstrual bleeding:no Postcoital bleeding: no Contraception: none at present Menarche at age: 52 Sexual activity: In a Monogamous Relationship History of sexually transmitted diseases: no History GYN procedures: IUD placement Abnormal pap smears: no   Dyspareunia: no Vaginal discharge:no Abdominal pain: no Galactorrhea: no  DEPRESSION/ANXIETY Continues on Wellbutrin XL 150 MG daily, recently has started to feel more emotionless. In past did use SR Wellbutrin dose, which offer help. Mood status: stable Satisfied with current treatment?: yes Symptom severity: mild  Duration of current treatment : chronic Side effects: no Medication compliance: good compliance Psychotherapy/counseling: yes in the past - did not have a good experience Previous psychiatric medications: Lexapro Depressed mood:  no Anxious mood:  sometimes Anhedonia:  some Significant weight loss or gain: no Insomnia: none Fatigue: yes Feelings of worthlessness or guilt: no Impaired concentration/indecisiveness: no Suicidal ideations: no Hopelessness: no Crying spells: no    01/05/2024   11:16 AM 06/10/2023    9:28 AM 03/26/2023    8:30 AM 01/11/2021    8:56 AM 12/20/2020   10:12 AM  Depression screen PHQ 2/9  Decreased Interest 0 1 0 0 1  Down, Depressed, Hopeless 0 1 0 0 1  PHQ - 2 Score 0 2 0 0 2  Altered sleeping 0 0 0 0 0  Tired, decreased energy 2 3 2  0 2  Change in appetite 1 2 1 1 1   Feeling bad or failure about yourself  0 1 1 1 1   Trouble concentrating 0 0 0 0 0  Moving slowly or fidgety/restless 0 0 0 0 0  Suicidal thoughts 0 0 0 0 0  PHQ-9 Score 3 8 4 2 6   Difficult doing work/chores Not difficult at all Not difficult at all Somewhat difficult Somewhat difficult Somewhat difficult       01/05/2024   11:17 AM 06/10/2023    9:28 AM 03/26/2023    8:31 AM 01/11/2021    1:26 PM  GAD 7 : Generalized Anxiety Score  Nervous, Anxious, on Edge 2 3 1 2   Control/stop worrying 0 1 1 0  Worry too much - different things 0 1 0 0  Trouble relaxing 2 2 1 2   Restless 0 0 0 1  Easily annoyed or irritable 2 3 2  3  Afraid - awful might happen 0 0 0 0  Total GAD 7 Score 6 10 5 8   Anxiety Difficulty Somewhat difficult Somewhat difficult Somewhat difficult    Relevant past medical, surgical, family and social history reviewed and updated as indicated. Interim medical history since our last visit reviewed. Allergies and medications reviewed and updated.  Review of Systems  Constitutional:  Negative for activity change, appetite change, diaphoresis, fatigue and fever.  Respiratory:  Negative for cough, chest tightness and shortness of breath.   Cardiovascular:  Negative for chest pain, palpitations and leg swelling.  Gastrointestinal: Negative.   Genitourinary:  Positive for menstrual problem.  Neurological:  Negative.   Psychiatric/Behavioral:  Negative for decreased concentration, self-injury, sleep disturbance and suicidal ideas. The patient is nervous/anxious.     Per HPI unless specifically indicated above     Objective:    BP 124/80   Pulse 65   Temp 98.6 F (37 C) (Oral)   Ht 5\' 8"  (1.727 m)   Wt 198 lb 3.2 oz (89.9 kg)   SpO2 98%   BMI 30.14 kg/m   Wt Readings from Last 3 Encounters:  01/05/24 198 lb 3.2 oz (89.9 kg)  08/18/23 195 lb (88.5 kg)  06/10/23 191 lb 12.8 oz (87 kg)    Physical Exam Vitals and nursing note reviewed.  Constitutional:      General: She is awake. She is not in acute distress.    Appearance: She is well-developed and well-groomed. She is not ill-appearing or toxic-appearing.  HENT:     Head: Normocephalic.     Right Ear: Hearing and external ear normal.     Left Ear: Hearing and external ear normal.  Eyes:     General: Lids are normal.        Right eye: No discharge.        Left eye: No discharge.     Conjunctiva/sclera: Conjunctivae normal.     Pupils: Pupils are equal, round, and reactive to light.  Neck:     Thyroid: No thyromegaly.     Vascular: No carotid bruit.  Cardiovascular:     Rate and Rhythm: Normal rate and regular rhythm.     Heart sounds: Normal heart sounds. No murmur heard.    No gallop.  Pulmonary:     Effort: Pulmonary effort is normal. No accessory muscle usage or respiratory distress.     Breath sounds: Normal breath sounds.  Abdominal:     General: Bowel sounds are normal. There is no distension.     Palpations: Abdomen is soft.     Tenderness: There is no abdominal tenderness.  Musculoskeletal:     Cervical back: Normal range of motion and neck supple.     Right lower leg: No edema.     Left lower leg: No edema.  Lymphadenopathy:     Cervical: No cervical adenopathy.  Skin:    General: Skin is warm and dry.  Neurological:     Mental Status: She is alert and oriented to person, place, and time.     Deep  Tendon Reflexes: Reflexes are normal and symmetric.     Reflex Scores:      Brachioradialis reflexes are 2+ on the right side and 2+ on the left side.      Patellar reflexes are 2+ on the right side and 2+ on the left side. Psychiatric:        Attention and Perception: Attention normal.  Mood and Affect: Mood normal.        Speech: Speech normal.        Behavior: Behavior normal. Behavior is cooperative.        Thought Content: Thought content normal.     Results for orders placed or performed in visit on 08/18/23  Veritor Flu A/B Waived   Collection Time: 08/18/23 11:30 AM  Result Value Ref Range   Influenza A Negative Negative   Influenza B Negative Negative  Novel Coronavirus, NAA ([facility])   Collection Time: 08/18/23  2:41 PM   Specimen: Nasopharyngeal(NP) swabs in vial transport medium  Result Value Ref Range   SARS-CoV-2, NAA Not Detected Not Detected      Assessment & Plan:   Problem List Items Addressed This Visit       Other   Anxiety and depression - Primary   Chronic, ongoing.  Denies SI/HI.  Some anhedonia present with 150 MG XL Wellbutrin, although not as much as she had with Lexapro.  Will change Wellbutrin to SR 100 MG daily dosing to see if less anhedonia, but still benefit to mood.  Discussed with patient, she thinks this was same as what was taken in past.  Educated on dose change.  Scores are improving significantly with Wellbutrin.      Relevant Medications   buPROPion ER (WELLBUTRIN SR) 100 MG 12 hr tablet   Menorrhagia with regular cycle   Since birth of last child has had heavier cycles.  Did not tolerate IUD in past.  Did benefit from Minipill with no ADR.  Will restart this.  Currently is on cycle, low pregnancy risk.  Instructed to start after her next menstrual period, or right away if appropriate.  Instructed as to importance of taking medication as directed, and encouraged to use condoms as well if at risk for STIs.          Follow up  plan: Return in about 6 weeks (around 02/16/2024) for MOOD AND BIRTH CONTROL -- virtual.

## 2024-01-05 NOTE — Assessment & Plan Note (Signed)
Since birth of last child has had heavier cycles.  Did not tolerate IUD in past.  Did benefit from Minipill with no ADR.  Will restart this.  Currently is on cycle, low pregnancy risk.  Instructed to start after her next menstrual period, or right away if appropriate.  Instructed as to importance of taking medication as directed, and encouraged to use condoms as well if at risk for STIs.

## 2024-02-14 NOTE — Patient Instructions (Signed)
 Managing Anxiety, Adult  After being diagnosed with anxiety, you may be relieved to know why you have felt or behaved a certain way. You may also feel overwhelmed about the treatment ahead and what it will mean for your life. With care and support, you can manage your anxiety.  How to manage lifestyle changes  Understanding the difference between stress and anxiety  Although stress can play a role in anxiety, it is not the same as anxiety. Stress is your body's reaction to life changes and events, both good and bad. Stress is often caused by something external, such as a deadline, test, or competition. It normally goes away after the event has ended and will last just a few hours. But, stress can be ongoing and can lead to more than just stress.  Anxiety is caused by something internal, such as imagining a terrible outcome or worrying that something will go wrong that will greatly upset you. Anxiety often does not go away even after the event is over, and it can become a long-term (chronic) worry.  Lowering stress and anxiety    Talk with your health care provider or a counselor to learn more about lowering anxiety and stress. They may suggest tension-reduction techniques, such as:  Music. Spend time creating or listening to music that you enjoy and that inspires you.  Mindfulness-based meditation. Practice being aware of your normal breaths while not trying to control your breathing. It can be done while sitting or walking.  Centering prayer. Focus on a word, phrase, or sacred image that means something to you and brings you peace.  Deep breathing. Expand your stomach and inhale slowly through your nose. Hold your breath for 3-5 seconds. Then breathe out slowly, letting your stomach muscles relax.  Self-talk. Learn to notice and spot thought patterns that lead to anxiety reactions. Change those patterns to thoughts that feel peaceful.  Muscle relaxation. Take time to tense muscles and then relax them.  Choose a  tension-reduction technique that fits your lifestyle and personality. These techniques take time and practice. Set aside 5-15 minutes a day to do them. Specialized therapists can offer counseling and training in these techniques. The training to help with anxiety may be covered by some insurance plans.  Other things you can do to manage stress and anxiety include:  Keeping a stress diary. This can help you learn what triggers your reaction and then learn ways to manage your response.  Thinking about how you react to certain situations. You may not be able to control everything, but you can control your response.  Making time for activities that help you relax and not feeling guilty about spending your time in this way.  Doing visual imagery. This involves imagining or creating mental pictures to help you relax.  Practicing yoga. Through yoga poses, you can lower tension and relax.     Medicines  Medicines for anxiety include:  Antidepressant medicines. These are usually prescribed for long-term daily control.  Anti-anxiety medicines. These may be added in severe cases, especially when panic attacks occur.  When used together, medicines, psychotherapy, and tension-reduction techniques may be the most effective treatment.  Relationships  Relationships can play a big part in helping you recover. Spend more time connecting with trusted friends and family members. Think about going to couples counseling if you have a partner, taking family education classes, or going to family therapy. Therapy can help you and others better understand your anxiety.  How to recognize changes in  your anxiety  Everyone responds differently to treatment for anxiety. Recovery from anxiety happens when symptoms lessen and stop interfering with your daily life at home or work. This may mean that you will start to:  Have better concentration and focus. Worry will interfere less in your daily thinking.  Sleep better.  Be less irritable.  Have  more energy.  Have improved memory.  Try to recognize when your condition is getting worse. Contact your provider if your symptoms interfere with home or work and you feel like your condition is not improving.  Follow these instructions at home:  Activity  Exercise. Adults should:  Exercise for at least 150 minutes each week. The exercise should increase your heart rate and make you sweat (moderate-intensity exercise).  Do strengthening exercises at least twice a week.  Get the right amount and quality of sleep. Most adults need 7-9 hours of sleep each night.  Lifestyle    Eat a healthy diet that includes plenty of vegetables, fruits, whole grains, low-fat dairy products, and lean protein.  Do not eat a lot of foods that are high in fats, added sugars, or salt (sodium).  Make choices that simplify your life.  Do not use any products that contain nicotine or tobacco. These products include cigarettes, chewing tobacco, and vaping devices, such as e-cigarettes. If you need help quitting, ask your provider.  Avoid caffeine, alcohol, and certain over-the-counter cold medicines. These may make you feel worse. Ask your pharmacist which medicines to avoid.  General instructions  Take over-the-counter and prescription medicines only as told by your provider.  Keep all follow-up visits. This is to make sure you are managing your anxiety well or if you need more support.  Where to find support  You can get help and support from:  Self-help groups.  Online and [employer].  A trusted spiritual leader.  Couples counseling.  Family education classes.  Family therapy.  Where to find more information  You may find that joining a support group helps you deal with your anxiety. The following sources can help you find counselors or support groups near you:  Mental Health America: mentalhealthamerica.net  Anxiety and Depression Association of [place] (ADAA): adaa.org  [employer] Mental Illness (NAMI):  nami.org  Contact a health care provider if:  You have a hard time staying focused or finishing tasks.  You spend many hours a day feeling worried about everyday life.  You are very tired because you cannot stop worrying.  You start to have headaches or often feel tense.  You have chronic nausea or diarrhea.  Get help right away if:  Your heart feels like it is racing.  You have shortness of breath.  You have thoughts of hurting yourself or others.  Get help right away if you feel like you may hurt yourself or others, or have thoughts about taking your own life. Go to your nearest emergency room or:  Call 911.  Call the National Suicide Prevention Lifeline at 2043122231 or 988. This is open 24 hours a day.  Text the Crisis Text Line at (832)170-7952.  This information is not intended to replace advice given to you by your health care provider. Make sure you discuss any questions you have with your health care provider.  Document Revised: 09/02/2022 Document Reviewed: 03/17/2021  Elsevier Patient Education  2024 [employer].

## 2024-02-16 ENCOUNTER — Telehealth: Payer: Self-pay | Admitting: Nurse Practitioner

## 2024-02-16 ENCOUNTER — Encounter: Payer: Self-pay | Admitting: Nurse Practitioner

## 2024-02-16 VITALS — Temp 100.8°F

## 2024-02-16 DIAGNOSIS — N920 Excessive and frequent menstruation with regular cycle: Secondary | ICD-10-CM | POA: Diagnosis not present

## 2024-02-16 DIAGNOSIS — F419 Anxiety disorder, unspecified: Secondary | ICD-10-CM | POA: Diagnosis not present

## 2024-02-16 DIAGNOSIS — F32A Depression, unspecified: Secondary | ICD-10-CM

## 2024-02-16 MED ORDER — BUPROPION HCL ER (SR) 100 MG PO TB12: 100.0000 mg | ORAL_TABLET | Freq: Every day | ORAL | 2 refills | Status: DC

## 2024-02-16 NOTE — Progress Notes (Signed)
 Appointment has been made

## 2024-02-16 NOTE — Assessment & Plan Note (Signed)
 Improving with Minipill on board and no ADR presenting.  Will continue this and adjust regimen as needed.

## 2024-02-16 NOTE — Progress Notes (Signed)
 Temp (!) 100.8 F (38.2 C) (Oral) Comment: Last night 8:00 pm  LMP 01/27/2024   Breastfeeding No    Subjective:    Patient ID: Elizabeth Harding, female    DOB: 07-09-1986, 38 y.o.   MRN: [id number]  HPI: Elizabeth Harding is a 38 y.o. female  Chief Complaint  Patient presents with   Mood   Contraception    Reduced and feeling better. Feels more even through the day   Virtual Visit via Video Note  I connected with Elizabeth Harding on 02/16/24 at 11:00 AM EDT by a video enabled telemedicine application and verified that I am speaking with the correct person using two identifiers.  Location: Patient: home Provider: work   I discussed the limitations of evaluation and management by telemedicine and the availability of in person appointments. The patient expressed understanding and agreed to proceed.  I discussed the assessment and treatment plan with the patient. The patient was provided an opportunity to ask questions and all were answered. The patient agreed with the plan and demonstrated an understanding of the instructions.   The patient was advised to call back or seek an in-person evaluation if the symptoms worsen or if the condition fails to improve as anticipated.  I provided 25 minutes of non-face-to-face time during this encounter.   Marjie Skiff, NP   DEPRESSION Follows up today for medication changes - Wellbutrin SR to 100 MG daily and Minipill added on 01/05/24.  Reports feeling more even throughout the day at present with SR dosing.  Recent cycle was lighter than previous.  No ADR. Mood status: stable Satisfied with current treatment?: yes Symptom severity: moderate  Duration of current treatment : chronic Side effects: no Medication compliance: good compliance Depressed mood: no Anxious mood: no Anhedonia: occasional Significant weight loss or gain: no Insomnia: yes hard to fall asleep Fatigue: yes Feelings of worthlessness or guilt: no Impaired  concentration/indecisiveness: no Suicidal ideations: no Hopelessness: no Crying spells: no    02/16/2024   11:05 AM 01/05/2024   11:16 AM 06/10/2023    9:28 AM 03/26/2023    8:30 AM 01/11/2021    8:56 AM  Depression screen PHQ 2/9  Decreased Interest 0 0 1 0 0  Down, Depressed, Hopeless 0 0 1 0 0  PHQ - 2 Score 0 0 2 0 0  Altered sleeping 0 0 0 0 0  Tired, decreased energy 2 2 3 2  0  Change in appetite 0 1 2 1 1   Feeling bad or failure about yourself  0 0 1 1 1   Trouble concentrating 0 0 0 0 0  Moving slowly or fidgety/restless 0 0 0 0 0  Suicidal thoughts 0 0 0 0 0  PHQ-9 Score 2 3 8 4 2   Difficult doing work/chores  Not difficult at all Not difficult at all Somewhat difficult Somewhat difficult       02/16/2024   11:06 AM 01/05/2024   11:17 AM 06/10/2023    9:28 AM 03/26/2023    8:31 AM  GAD 7 : Generalized Anxiety Score  Nervous, Anxious, on Edge 0 2 3 1   Control/stop worrying 0 0 1 1  Worry too much - different things 0 0 1 0  Trouble relaxing 2 2 2 1   Restless 0 0 0 0  Easily annoyed or irritable 2 2 3 2   Afraid - awful might happen 0 0 0 0  Total GAD 7 Score 4 6 10 5   Anxiety Difficulty  Somewhat  difficult Somewhat difficult Somewhat difficult   Relevant past medical, surgical, family and social history reviewed and updated as indicated. Interim medical history since our last visit reviewed. Allergies and medications reviewed and updated.  Review of Systems  Constitutional:  Negative for activity change, appetite change, diaphoresis, fatigue and fever.  Respiratory:  Negative for cough, chest tightness and shortness of breath.   Cardiovascular:  Negative for chest pain, palpitations and leg swelling.  Gastrointestinal: Negative.   Genitourinary:  Negative for menstrual problem.  Neurological: Negative.   Psychiatric/Behavioral:  Positive for sleep disturbance. Negative for decreased concentration, self-injury and suicidal ideas. The patient is nervous/anxious.     Per  HPI unless specifically indicated above     Objective:    Temp (!) 100.8 F (38.2 C) (Oral) Comment: Last night 8:00 pm  LMP 01/27/2024   Breastfeeding No   Wt Readings from Last 3 Encounters:  01/05/24 198 lb 3.2 oz (89.9 kg)  08/18/23 195 lb (88.5 kg)  06/10/23 191 lb 12.8 oz (87 kg)    Physical Exam Vitals and nursing note reviewed.  Constitutional:      General: She is awake. She is not in acute distress.    Appearance: She is well-developed. She is not ill-appearing.  HENT:     Head: Normocephalic.     Right Ear: Hearing normal.     Left Ear: Hearing normal.  Eyes:     General: Lids are normal.        Right eye: No discharge.        Left eye: No discharge.     Conjunctiva/sclera: Conjunctivae normal.  Pulmonary:     Effort: Pulmonary effort is normal. No accessory muscle usage or respiratory distress.  Musculoskeletal:     Cervical back: Normal range of motion.  Neurological:     Mental Status: She is alert and oriented to person, place, and time.  Psychiatric:        Attention and Perception: Attention normal.        Mood and Affect: Mood normal.        Behavior: Behavior normal. Behavior is cooperative.        Thought Content: Thought content normal.        Judgment: Judgment normal.     Results for orders placed or performed in visit on 08/18/23  Veritor Flu A/B Waived   Collection Time: 08/18/23 11:30 AM  Result Value Ref Range   Influenza A Negative Negative   Influenza B Negative Negative  Novel Coronavirus, NAA ([facility])   Collection Time: 08/18/23  2:41 PM   Specimen: Nasopharyngeal(NP) swabs in vial transport medium  Result Value Ref Range   SARS-CoV-2, NAA Not Detected Not Detected      Assessment & Plan:   Problem List Items Addressed This Visit       Other   Anxiety and depression - Primary   Chronic, ongoing.  Denies SI/HI.  Tolerating and finding benefit from Wellbutrin 100 MG in SR dosing.  Scores are improving.  Will continue this  and adjust as needed. Scores are improving significantly with Wellbutrin.      Relevant Medications   buPROPion ER (WELLBUTRIN SR) 100 MG 12 hr tablet   Menorrhagia with regular cycle   Improving with Minipill on board and no ADR presenting.  Will continue this and adjust regimen as needed.        Follow up plan: Return for Annual Physical after 06/09/24.

## 2024-02-16 NOTE — Assessment & Plan Note (Signed)
 Chronic, ongoing.  Denies SI/HI.  Tolerating and finding benefit from Wellbutrin 100 MG in SR dosing.  Scores are improving.  Will continue this and adjust as needed. Scores are improving significantly with Wellbutrin.

## 2024-03-25 ENCOUNTER — Encounter: Payer: Self-pay | Admitting: Nurse Practitioner

## 2024-03-25 ENCOUNTER — Ambulatory Visit: Admitting: Nurse Practitioner

## 2024-03-25 VITALS — BP 116/73 | HR 60 | Temp 97.9°F | Wt 198.8 lb

## 2024-03-25 DIAGNOSIS — L03012 Cellulitis of left finger: Secondary | ICD-10-CM | POA: Diagnosis not present

## 2024-03-25 MED ORDER — MUPIROCIN 2 % EX OINT: 1.0000 | TOPICAL_OINTMENT | Freq: Two times a day (BID) | CUTANEOUS | 1 refills | Status: DC

## 2024-03-25 MED ORDER — AMOXICILLIN-POT CLAVULANATE 875-125 MG PO TABS: 1.0000 | ORAL_TABLET | Freq: Two times a day (BID) | ORAL | 0 refills | Status: AC

## 2024-03-25 NOTE — Assessment & Plan Note (Signed)
 Acute for a few days, reports some improvement but not 100%.  Will send in Augmentin , she is breastfeeding, to take BID for 7 days.  Has tolerated this in past.  Also sent in Mupirocin  ointment to apply to area.  Recommend keeping area clean.  May take Tylenol  as needed for pain.  Place ice to area frequently during daytime to help reduce swelling.

## 2024-03-25 NOTE — Patient Instructions (Signed)
 Paronychia Paronychia is an infection of the skin that surrounds a nail. It usually affects the skin around a fingernail, but it may also occur near a toenail. It often causes pain and swelling around the nail. In some cases, a collection of pus (abscess)

## 2024-03-25 NOTE — Progress Notes (Signed)
 BP 116/73   Pulse 60   Temp 97.9 F (36.6 C) (Oral)   Wt 198 lb 12.8 oz (90.2 kg)   SpO2 98%   BMI 30.23 kg/m    Subjective:    Patient ID: Elizabeth Harding, female    DOB: 1986/10/25, 38 y.o.   MRN: [id number]  HPI: Elizabeth Harding is a 38 y.o. female  Chief Complaint  Patient presents with   Hand Pain    Patient states she has been having pain around her nail on her L middle finger since Sunday. States it has been swollen, red, and painful.    FINGER PAIN Started on Sunday with pain to left middle finger. No recent injuries to finger or hangnails.   Duration: days Involved hand: left Mechanism of injury: unknown Location: dorsal Onset: sudden Severity: 4/10  Quality: dull, aching, and throbbing Frequency: constant Radiation: no Aggravating factors: pressure placed on finger Alleviating factors: nothing Treatments attempted: wrapped with bandage Relief with NSAIDs?: No NSAIDs Taken Weakness: no Numbness: no Redness: yes Swelling:yes Bruising: no Fevers: no   Relevant past medical, surgical, family and social history reviewed and updated as indicated. Interim medical history since our last visit reviewed. Allergies and medications reviewed and updated.  Review of Systems  Constitutional:  Negative for activity change, appetite change, diaphoresis, fatigue and fever.  Respiratory:  Negative for cough, chest tightness, shortness of breath and wheezing.   Cardiovascular:  Negative for chest pain, palpitations and leg swelling.  Gastrointestinal: Negative.   Skin:  Positive for wound.  Psychiatric/Behavioral: Negative.      Per HPI unless specifically indicated above     Objective:    BP 116/73   Pulse 60   Temp 97.9 F (36.6 C) (Oral)   Wt 198 lb 12.8 oz (90.2 kg)   SpO2 98%   BMI 30.23 kg/m   Wt Readings from Last 3 Encounters:  03/25/24 198 lb 12.8 oz (90.2 kg)  01/05/24 198 lb 3.2 oz (89.9 kg)  08/18/23 195 lb (88.5 kg)    Physical  Exam Vitals and nursing note reviewed.  Constitutional:      General: She is awake. She is not in acute distress.    Appearance: She is well-developed and well-groomed. She is not ill-appearing or toxic-appearing.  HENT:     Head: Normocephalic.     Right Ear: Hearing and external ear normal.     Left Ear: Hearing and external ear normal.  Eyes:     General: Lids are normal.        Right eye: No discharge.        Left eye: No discharge.     Conjunctiva/sclera: Conjunctivae normal.     Pupils: Pupils are equal, round, and reactive to light.  Neck:     Thyroid : No thyromegaly.     Vascular: No carotid bruit.  Cardiovascular:     Rate and Rhythm: Normal rate and regular rhythm.     Heart sounds: Normal heart sounds. No murmur heard.    No gallop.  Pulmonary:     Effort: Pulmonary effort is normal. No accessory muscle usage or respiratory distress.     Breath sounds: Normal breath sounds.  Abdominal:     General: Bowel sounds are normal. There is no distension.     Palpations: Abdomen is soft.     Tenderness: There is no abdominal tenderness.  Musculoskeletal:     Cervical back: Normal range of motion and neck supple.  Right lower leg: No edema.     Left lower leg: No edema.  Lymphadenopathy:     Cervical: No cervical adenopathy.  Skin:    General: Skin is warm and dry.     Findings: Wound present.     Comments: Left middle finger lateral aspect with mild edema, warmth, and swelling.  Tenderness to touch.    Neurological:     Mental Status: She is alert and oriented to person, place, and time.     Deep Tendon Reflexes: Reflexes are normal and symmetric.     Reflex Scores:      Brachioradialis reflexes are 2+ on the right side and 2+ on the left side.      Patellar reflexes are 2+ on the right side and 2+ on the left side. Psychiatric:        Attention and Perception: Attention normal.        Mood and Affect: Mood normal.        Speech: Speech normal.        Behavior:  Behavior normal. Behavior is cooperative.        Thought Content: Thought content normal.    Results for orders placed or performed in visit on 08/18/23  Veritor Flu A/B Waived   Collection Time: 08/18/23 11:30 AM  Result Value Ref Range   Influenza A Negative Negative   Influenza B Negative Negative  Novel Coronavirus, NAA ([facility])   Collection Time: 08/18/23  2:41 PM   Specimen: Nasopharyngeal(NP) swabs in vial transport medium  Result Value Ref Range   SARS-CoV-2, NAA Not Detected Not Detected      Assessment & Plan:   Problem List Items Addressed This Visit       Musculoskeletal and Integument   Paronychia of left middle finger - Primary   Acute for a few days, reports some improvement but not 100%.  Will send in Augmentin , she is breastfeeding, to take BID for 7 days.  Has tolerated this in past.  Also sent in Mupirocin  ointment to apply to area.  Recommend keeping area clean.  May take Tylenol  as needed for pain.  Place ice to area frequently during daytime to help reduce swelling.      Relevant Medications   mupirocin  ointment (BACTROBAN ) 2 %     Follow up plan: Return if symptoms worsen or fail to improve.

## 2024-05-18 ENCOUNTER — Encounter: Payer: Self-pay | Admitting: Nurse Practitioner

## 2024-05-23 ENCOUNTER — Encounter: Payer: Self-pay | Admitting: Nurse Practitioner

## 2024-05-23 ENCOUNTER — Telehealth: Admitting: Family Medicine

## 2024-05-23 ENCOUNTER — Ambulatory Visit: Payer: Self-pay

## 2024-05-23 ENCOUNTER — Ambulatory Visit: Admitting: Nurse Practitioner

## 2024-05-23 VITALS — BP 104/66 | HR 66 | Temp 98.2°F | Resp 15 | Ht 67.99 in | Wt 196.4 lb

## 2024-05-23 DIAGNOSIS — J029 Acute pharyngitis, unspecified: Secondary | ICD-10-CM

## 2024-05-23 DIAGNOSIS — J0110 Acute frontal sinusitis, unspecified: Secondary | ICD-10-CM | POA: Diagnosis not present

## 2024-05-23 MED ORDER — LIDOCAINE VISCOUS HCL 2 % MT SOLN: 15.0000 mL | OROMUCOSAL | 0 refills | Status: DC | PRN

## 2024-05-23 MED ORDER — AMOXICILLIN-POT CLAVULANATE 875-125 MG PO TABS: 1.0000 | ORAL_TABLET | Freq: Two times a day (BID) | ORAL | 0 refills | Status: AC

## 2024-05-23 NOTE — Telephone Encounter (Signed)
 FYI Only or Action Required?: FYI only for provider  Patient was last seen in primary care on 03/25/2024 by Cannady, Jolene T, NP. Called Nurse Triage reporting Sore Throat. Symptoms began several days ago. 9 days ago Interventions attempted: OTC medications: IBU Tylenol . Symptoms are: gradually worsening.  Triage Disposition: See Physician Within 24 Hours  Patient/caregiver understands and will follow disposition?: Yes              Copied from CRM [id number]. Topic: Clinical - Red Word Triage >> May 23, 2024  8:03 AM Emylou G wrote: [facility] that prompted transfer to Nurse Triage: Very painful sore throat.. cough, yellow mucus, headache.. Reason for Disposition  SEVERE (e.g., excruciating) throat pain  Answer Assessment - Initial Assessment Questions 1. ONSET: When did the throat start hurting? (Hours or days ago)      9 days ago 2. SEVERITY: How bad is the sore throat? (Scale 1-10; mild, moderate or severe)   - MILD (1-3):  Doesn't interfere with eating or normal activities.   - MODERATE (4-7): Interferes with eating some solids and normal activities.   - SEVERE (8-10):  Excruciating pain, interferes with most normal activities.   - SEVERE WITH DYSPHAGIA (10): Can't swallow liquids, drooling.     8/10 3. STREP EXPOSURE: Has there been any exposure to strep within the past week? If Yes, ask: What type of contact occurred?      Unsure - teacher 4.  VIRAL SYMPTOMS: Are there any symptoms of a cold, such as a runny nose, cough, hoarse voice or red eyes?      Yes - Cough, HA, Yellow sinus discharge 5. FEVER: Do you have a fever? If Yes, ask: What is your temperature, how was it measured, and when did it start?     no 6. PUS ON THE TONSILS: Is there pus on the tonsils in the back of your throat?     Unsure 7. OTHER SYMPTOMS: Do you have any other symptoms? (e.g., difficulty breathing, headache, rash)     Also popped a blood vessel in eye 8. PREGNANCY: Is there  any chance you are pregnant? When was your last menstrual period?     no  Protocols used: Sore Throat-A-AH

## 2024-05-23 NOTE — Progress Notes (Signed)
 BP 104/66 (BP Location: Left Arm, Patient Position: Sitting, Cuff Size: Large)   Pulse 66   Temp 98.2 F (36.8 C) (Oral)   Resp 15   Ht 5' 7.99 (1.727 m)   Wt 196 lb 6.4 oz (89.1 kg)   LMP 05/11/2024 (Exact Date)   SpO2 99%   Breastfeeding Yes   BMI 29.87 kg/m    Subjective:    Patient ID: Elizabeth Harding, female    DOB: 04/07/1986, 38 y.o.   MRN: [id number]  HPI: Elizabeth Harding is a 38 y.o. female  Chief Complaint  Patient presents with   URI    Started about 89 days ago. Friday/Saturday started having a sore throat again. Troubles with talking and having pain with swallowing. Thick phlegm when able to get it up. Headache all most of last week. Ibuprofen  and Tylenol  around the clock has helped with headache.    UPPER RESPIRATORY TRACT INFECTION Has been sick for 9 days. Started with sinus head cold. Fever: no Cough: yes Shortness of breath: no Wheezing: no Chest pain: no Chest tightness: no Chest congestion: no Nasal congestion: yes Runny nose: yes Post nasal drip: yes Sneezing: no Sore throat: yes Swollen glands: no Sinus pressure: yes Headache: yes Face pain: no Toothache: no Ear pain: yes bilateral Ear pressure: yes bilateral Eyes red/itching:no Eye drainage/crusting: no  Vomiting: no Rash: no Fatigue: yes Sick contacts: no Strep contacts: no  Context: worse Recurrent sinusitis: no Relief with OTC cold/cough medications: no  Treatments attempted: cold/sinus , Mucinex, Tylenol  and Ibuprofen   Relevant past medical, surgical, family and social history reviewed and updated as indicated. Interim medical history since our last visit reviewed. Allergies and medications reviewed and updated.  Review of Systems  Constitutional:  Positive for fatigue. Negative for activity change, appetite change, chills and fever.  HENT:  Positive for congestion, ear pain, postnasal drip, rhinorrhea, sinus pressure and sore throat. Negative for ear discharge, facial  swelling, sinus pain, sneezing and voice change.   Respiratory:  Positive for cough. Negative for chest tightness, shortness of breath and wheezing.   Cardiovascular:  Negative for chest pain, palpitations and leg swelling.  Gastrointestinal: Negative.   Neurological:  Positive for headaches. Negative for dizziness and numbness.  Psychiatric/Behavioral: Negative.      Per HPI unless specifically indicated above     Objective:    BP 104/66 (BP Location: Left Arm, Patient Position: Sitting, Cuff Size: Large)   Pulse 66   Temp 98.2 F (36.8 C) (Oral)   Resp 15   Ht 5' 7.99 (1.727 m)   Wt 196 lb 6.4 oz (89.1 kg)   LMP 05/11/2024 (Exact Date)   SpO2 99%   Breastfeeding Yes   BMI 29.87 kg/m   Wt Readings from Last 3 Encounters:  05/23/24 196 lb 6.4 oz (89.1 kg)  03/25/24 198 lb 12.8 oz (90.2 kg)  01/05/24 198 lb 3.2 oz (89.9 kg)    Physical Exam Vitals and nursing note reviewed.  Constitutional:      General: She is awake. She is not in acute distress.    Appearance: She is well-developed and well-groomed. She is obese. She is ill-appearing. She is not toxic-appearing.  HENT:     Head: Normocephalic.     Right Ear: Hearing, ear canal and external ear normal. A middle ear effusion is present. Tympanic membrane is not injected or perforated.     Left Ear: Hearing, ear canal and external ear normal. A middle ear effusion is present.  Tympanic membrane is not injected or perforated.     Nose: Rhinorrhea present. Rhinorrhea is clear.     Right Sinus: Frontal sinus tenderness present. No maxillary sinus tenderness.     Left Sinus: Frontal sinus tenderness present. No maxillary sinus tenderness.     Mouth/Throat:     Mouth: Mucous membranes are moist.     Pharynx: Posterior oropharyngeal erythema (mild) present. No pharyngeal swelling or oropharyngeal exudate.   Eyes:     General: Lids are normal.        Right eye: No discharge.        Left eye: No discharge.      Conjunctiva/sclera: Conjunctivae normal.     Pupils: Pupils are equal, round, and reactive to light.   Neck:     Thyroid : No thyromegaly.     Vascular: No carotid bruit.   Cardiovascular:     Rate and Rhythm: Normal rate and regular rhythm.     Heart sounds: Normal heart sounds. No murmur heard.    No gallop.  Pulmonary:     Effort: Pulmonary effort is normal. No accessory muscle usage or respiratory distress.     Breath sounds: No decreased breath sounds, wheezing or rales.  Abdominal:     General: Bowel sounds are normal.     Palpations: Abdomen is soft. There is no hepatomegaly or splenomegaly.   Musculoskeletal:     Cervical back: Normal range of motion and neck supple.     Right lower leg: No edema.     Left lower leg: No edema.  Lymphadenopathy:     Head:     Right side of head: Submandibular and tonsillar adenopathy present. No submental, preauricular or posterior auricular adenopathy.     Left side of head: Submandibular and tonsillar adenopathy present. No submental, preauricular or posterior auricular adenopathy.     Cervical: No cervical adenopathy.   Skin:    General: Skin is warm and dry.   Neurological:     Mental Status: She is alert and oriented to person, place, and time.   Psychiatric:        Attention and Perception: Attention normal.        Mood and Affect: Mood normal.        Speech: Speech normal.        Behavior: Behavior normal. Behavior is cooperative.        Thought Content: Thought content normal.    Results for orders placed or performed in visit on 08/18/23  Veritor Flu A/B Waived   Collection Time: 08/18/23 11:30 AM  Result Value Ref Range   Influenza A Negative Negative   Influenza B Negative Negative  Novel Coronavirus, NAA ([facility])   Collection Time: 08/18/23  2:41 PM   Specimen: Nasopharyngeal(NP) swabs in vial transport medium  Result Value Ref Range   SARS-CoV-2, NAA Not Detected Not Detected      Assessment & Plan:    Problem List Items Addressed This Visit       Respiratory   Sinusitis, acute frontal - Primary   Acute for 9 days with no improvement.  Strep negative.  Discussed and reviewed with patient.  Will send for culture.  Start Augmentin  BID for 7 days and Viscous Lidocaine  provided for sore throat.  She wishes to defer steroid.  Recommend: - Increased rest - Increasing Fluids - Acetaminophen  / ibuprofen  as needed for fever/pain.  - Salt water gargling, chloraseptic spray and throat lozenges - Mucinex.  - Saline sinus  flushes or a neti pot.  - Humidifying the air.       Relevant Medications   amoxicillin -clavulanate (AUGMENTIN ) 875-125 MG tablet   Other Visit Diagnoses       Sore throat       Refer to sinusitis plan of care.   Relevant Orders   Rapid Strep Screen ([facility] ONLY)        Follow up plan: Return if symptoms worsen or fail to improve.

## 2024-05-23 NOTE — Progress Notes (Signed)
 Appt scheduled

## 2024-05-23 NOTE — Patient Instructions (Signed)

## 2024-05-23 NOTE — Assessment & Plan Note (Signed)
 Acute for 9 days with no improvement.  Strep negative.  Discussed and reviewed with patient.  Will send for culture.  Start Augmentin  BID for 7 days and Viscous Lidocaine  provided for sore throat.  She wishes to defer steroid.  Recommend: - Increased rest - Increasing Fluids - Acetaminophen  / ibuprofen  as needed for fever/pain.  - Salt water gargling, chloraseptic spray and throat lozenges - Mucinex.  - Saline sinus flushes or a neti pot.  - Humidifying the air.

## 2024-05-23 NOTE — Progress Notes (Signed)
 Is going to be seen at PCP office today at 320, as per Jolene NP  See note in [facility]  Patient acknowledged agreement and understanding of the plan.

## 2024-05-25 ENCOUNTER — Ambulatory Visit: Payer: Self-pay | Admitting: Nurse Practitioner

## 2024-05-26 LAB — CULTURE, GROUP A STREP: Strep A Culture: NEGATIVE

## 2024-05-26 LAB — RAPID STREP SCREEN (MED CTR MEBANE ONLY): Strep Gp A Ag, IA W/Reflex: NEGATIVE

## 2024-05-26 NOTE — Progress Notes (Signed)
 Contacted via MyChart  Negative strep on culture too

## 2024-07-04 NOTE — Patient Instructions (Incomplete)
 Be Involved in Caring For Your Health:  Taking Medications When medications are taken as directed, they can greatly improve your health. But if they are not taken as prescribed, they may not work. In some cases, not taking them correctly can be harmful. To help ensure your treatment remains effective and safe, understand your medications and how to take them. Bring your medications to each visit for review by your provider.  Your lab results, notes, and after visit summary will be available on My Chart. We strongly encourage you to use this feature. If lab results are abnormal the clinic will contact you with the appropriate steps. If the clinic does not contact you assume the results are satisfactory. You can always view your results on My Chart. If you have questions regarding your health or results, please contact the clinic during office hours. You can also ask questions on My Chart.  We at [facility] are grateful that you chose [place] to provide your care. We strive to provide evidence-based and compassionate care and are always looking for feedback. If you get a survey from the clinic please complete this so we can hear your opinions.  Healthy Eating, Adult Healthy eating may help you get and keep a healthy body weight, reduce the risk of chronic disease, and live a long and productive life. It is important to follow a healthy eating pattern. Your nutritional and calorie needs should be met mainly by different nutrient-rich foods. What are tips for following this plan? Reading food labels Read labels and choose the following: Reduced or low sodium products. Juices with 100% fruit juice. Foods with low saturated fats (<3 g per serving) and high polyunsaturated and monounsaturated fats. Foods with whole grains, such as whole wheat, cracked wheat, brown rice, and wild rice. Whole grains that are fortified with folic acid. This is recommended for females who are pregnant or who want  to become pregnant. Read labels and do not eat or drink the following: Foods or drinks with added sugars. These include foods that contain brown sugar, corn sweetener, corn syrup, dextrose, fructose, glucose, high-fructose corn syrup, honey, invert sugar, lactose, malt syrup, maltose, molasses, raw sugar, sucrose, trehalose, or turbinado sugar. Limit your intake of added sugars to less than 10% of your total daily calories. Do not eat more than the following amounts of added sugar per day: 6 teaspoons (25 g) for females. 9 teaspoons (38 g) for males. Foods that contain processed or refined starches and grains. Refined grain products, such as white flour, degermed cornmeal, white bread, and white rice. Shopping Choose nutrient-rich snacks, such as vegetables, whole fruits, and nuts. Avoid high-calorie and high-sugar snacks, such as potato chips, fruit snacks, and candy. Use oil-based dressings and spreads on foods instead of solid fats such as butter, margarine, sour cream, or cream cheese. Limit pre-made sauces, mixes, and "instant" products such as flavored rice, instant noodles, and ready-made pasta. Try more plant-protein sources, such as tofu, tempeh, black beans, edamame, lentils, nuts, and seeds. Explore eating plans such as the Mediterranean diet or vegetarian diet. Try heart-healthy dips made with beans and healthy fats like hummus and guacamole. Vegetables go great with these. Cooking Use oil to saut or stir-fry foods instead of solid fats such as butter, margarine, or lard. Try baking, boiling, grilling, or broiling instead of frying. Remove the fatty part of meats before cooking. Steam vegetables in water or broth. Meal planning  At meals, imagine dividing your plate into fourths: One-half of  your plate is fruits and vegetables. One-fourth of your plate is whole grains. One-fourth of your plate is protein, especially lean meats, poultry, eggs, tofu, beans, or nuts. Include  low-fat dairy as part of your daily diet. Lifestyle Choose healthy options in all settings, including home, work, school, restaurants, or stores. Prepare your food safely: Wash your hands after handling raw meats. Where you prepare food, keep surfaces clean by regularly washing with hot, soapy water. Keep raw meats separate from ready-to-eat foods, such as fruits and vegetables. Cook seafood, meat, poultry, and eggs to the recommended temperature. Get a food thermometer. Store foods at safe temperatures. In general: Keep cold foods at 76F (4.4C) or below. Keep hot foods at 176F (60C) or above. Keep your freezer at [facility] (-17.8C) or below. Foods are not safe to eat if they have been between the temperatures of 40-176F (4.4-60C) for more than 2 hours. What foods should I eat? Fruits Aim to eat 1-2 cups of fresh, canned (in natural juice), or frozen fruits each day. One cup of fruit equals 1 small apple, 1 large banana, 8 large strawberries, 1 cup (237 g) canned fruit,  cup (82 g) dried fruit, or 1 cup (240 mL) 100% juice. Vegetables Aim to eat 2-4 cups of fresh and frozen vegetables each day, including different varieties and colors. One cup of vegetables equals 1 cup (91 g) broccoli or cauliflower florets, 2 medium carrots, 2 cups (150 g) raw, leafy greens, 1 large tomato, 1 large bell pepper, 1 large sweet potato, or 1 medium white potato. Grains Aim to eat 5-10 ounce-equivalents of whole grains each day. Examples of 1 ounce-equivalent of grains include 1 slice of bread, 1 cup (40 g) ready-to-eat cereal, 3 cups (24 g) popcorn, or  cup (93 g) cooked rice. Meats and other proteins Try to eat 5-7 ounce-equivalents of protein each day. Examples of 1 ounce-equivalent of protein include 1 egg,  oz nuts (12 almonds, 24 pistachios, or 7 walnut halves), 1/4 cup (90 g) cooked beans, 6 tablespoons (90 g) hummus or 1 tablespoon (16 g) peanut butter. A cut of meat or fish that is the size of a deck  of cards is about 3-4 ounce-equivalents (85 g). Of the protein you eat each week, try to have at least 8 sounce (227 g) of seafood. This is about 2 servings per week. This includes salmon, trout, herring, sardines, and anchovies. Dairy Aim to eat 3 cup-equivalents of fat-free or low-fat dairy each day. Examples of 1 cup-equivalent of dairy include 1 cup (240 mL) milk, 8 ounces (250 g) yogurt, 1 ounces (44 g) natural cheese, or 1 cup (240 mL) fortified soy milk. Fats and oils Aim for about 5 teaspoons (21 g) of fats and oils per day. Choose monounsaturated fats, such as canola and olive oils, mayonnaise made with olive oil or avocado oil, avocados, peanut butter, and most nuts, or polyunsaturated fats, such as sunflower, corn, and soybean oils, walnuts, pine nuts, sesame seeds, sunflower seeds, and flaxseed. Beverages Aim for 6 eight-ounce glasses of water per day. Limit coffee to 3-5 eight-ounce cups per day. Limit caffeinated beverages that have added calories, such as soda and energy drinks. If you drink alcohol: Limit how much you have to: 0-1 drink a day if you are female. 0-2 drinks a day if you are female. Know how much alcohol is in your drink. In the [place] one drink is one 12 oz bottle of beer (355 mL), one 5 oz glass of wine (  148 mL), or one 1 oz glass of hard liquor (44 mL). Seasoning and other foods Try not to add too much salt to your food. Try using herbs and spices instead of salt. Try not to add sugar to food. This information is based on [place] nutrition guidelines. To learn more, visit [employer].be. Exact amounts may vary. You may need different amounts. This information is not intended to replace advice given to you by your health care provider. Make sure you discuss any questions you have with your health care provider. Document Revised: 08/25/2022 Document Reviewed: 08/25/2022 Elsevier Patient Education  2024 [employer].

## 2024-07-08 ENCOUNTER — Encounter: Payer: Self-pay | Admitting: Nurse Practitioner

## 2024-07-08 DIAGNOSIS — N920 Excessive and frequent menstruation with regular cycle: Secondary | ICD-10-CM

## 2024-07-08 DIAGNOSIS — J45990 Exercise induced bronchospasm: Secondary | ICD-10-CM

## 2024-07-08 DIAGNOSIS — Z0000 Encounter for general adult medical examination without abnormal findings: Secondary | ICD-10-CM

## 2024-07-08 DIAGNOSIS — Z13220 Encounter for screening for lipoid disorders: Secondary | ICD-10-CM

## 2024-07-08 DIAGNOSIS — F32A Depression, unspecified: Secondary | ICD-10-CM

## 2024-07-21 ENCOUNTER — Ambulatory Visit: Payer: Self-pay

## 2024-07-21 ENCOUNTER — Ambulatory Visit
Admission: RE | Admit: 2024-07-21 | Discharge: 2024-07-21 | Disposition: A | Discharge status: Home / Self Care | Attending: Emergency Medicine | Admitting: Emergency Medicine

## 2024-07-21 VITALS — BP 109/77 | HR 76 | Temp 97.7°F | Resp 18

## 2024-07-21 DIAGNOSIS — S61216A Laceration without foreign body of right little finger without damage to nail, initial encounter: Secondary | ICD-10-CM

## 2024-07-21 MED ORDER — CEPHALEXIN 500 MG PO CAPS: 500.0000 mg | ORAL_CAPSULE | Freq: Three times a day (TID) | ORAL | 0 refills | Status: AC

## 2024-07-21 NOTE — Telephone Encounter (Signed)
 FYI Only or Action Required?: Action required by provider: request for appointment.  Patient was last seen in primary care on 05/23/2024 by Elizabeth Melanie DASEN, NP.  Called Nurse Triage reporting Laceration.  Symptoms began yesterday.  Interventions attempted: Other: washed wound, applied pressure, gauze bandage.  Symptoms are: right pinky finger laceration size of a thumbnail with skin flap still attached stable.  Triage Disposition: See Physician Within 24 Hours (overriding Home Care)  Patient/caregiver understands and will follow disposition?: Yes      Copied from CRM #[id number]. Topic: Clinical - Red Word Triage >> Jul 21, 2024  7:56 AM Cleave MATSU wrote: Red Word that prompted transfer to Nurse Triage: pt  has a cut on her finger that happened yesterday . Also needs physical scheduled. Reason for Disposition  Minor cut or scratch  Answer Assessment - Initial Assessment Questions Patient states she washed the wound with water and has not cleaned with soap or applied any ointment. She has wrapped it in a gauze bandage.  1. APPEARANCE of INJURY: What does the injury look like?      She states it has left a flap of skin barely attached.  2. ONSET: How long ago did the injury occur?      Yesterday around 2-2:30pm.  3. LOCATION: Where is the injury located?      Right pinky finger.  4. SIZE: How large is the cut?      She states it is less than an inch. Size of a thumbnail  5. BLEEDING: Is it bleeding now? If Yes, ask: Is it difficult to stop?      No bleeding at this time. She states it took about 15-30 minutes for bleeding to stop with pressure. She states the gauze she has used gets stuck in the wound and will reopen the wound (not bleeding at bad as when it first started).  6. PAIN: Is there any pain? If Yes, ask: How bad is the pain? (Scale 0-10; or none, mild, moderate, severe)     Yes, especially with pressure 3/10.  7. MECHANISM: Tell me how it  happened.      She states it was cut with a mandoline slicer while cutting carrots.  8. TETANUS: When was your last tetanus booster?     2023.  9. PREGNANCY: Is there any chance you are pregnant? When was your last menstrual period?     LMP: July 03, 2024.  Protocols used: Cuts and Lacerations-A-AH

## 2024-07-21 NOTE — ED Triage Notes (Signed)
 Patient to Urgent Care with complaints of a laceration on her right sided, little finger.  Reports that she was cutting carrots using a mandolin yesterday and cut her finger during the process. Bandage in place since approx 10pm.   TDAP- 2023.

## 2024-07-21 NOTE — Discharge Instructions (Addendum)
 Take the cephalexin  as directed.    Keep your wound clean and dry.  Wash it gently twice a day with soap and water.  Then apply an antibiotic cream and bandage.    Follow up right away if you see signs of infection, such as redness, pus-like drainage, fever, or other concerning symptoms.

## 2024-07-21 NOTE — Telephone Encounter (Signed)
 See message below and contact patient to schedule please.

## 2024-07-21 NOTE — ED Provider Notes (Signed)
 Elizabeth Harding    CSN: [id number] Arrival date & time: 07/21/24  1258      History   Chief Complaint Chief Complaint  Patient presents with   Finger Injury    Entered by patient    HPI Elizabeth Harding is a 38 y.o. female.  Patient presents with a laceration on her right little finger that occurred approximately 23 hours ago.  She cut her finger while using a mandolin to slice carrots.  She treated the laceration with direct pressure and a bandage.  She noted some bleeding from the wound last night when she change the bandage.  She has not changed the bandage today.  She denies fever, chills, numbness, weakness.  Last tetanus 2023.  The history is provided by the patient and medical records.    Past Medical History:  Diagnosis Date   Allergic rhinitis due to pollen    hay fever   Allergy    Seasonal   Anxiety    with aggitation and anger   Asthma    Exercise induced, inhaler not often needed   Chicken pox    Depression 2015   GERD (gastroesophageal reflux disease)    During pregnancy   Irregular intermenstrual bleeding    Jaundice    as a newborn   Traumatic injury during pregnancy in third trimester 12/24/2015    Patient Active Problem List   Diagnosis Date Noted   Menorrhagia with regular cycle 01/05/2024   Anxiety and depression 03/26/2023   Type O blood, Rh negative 07/12/2020   Exercise-induced asthma 05/24/2012    Past Surgical History:  Procedure Laterality Date   CERVICAL BIOPSY  W/ LOOP ELECTRODE EXCISION  08/09/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 Authorizing Provider  cephALEXin  (KEFLEX ) 500 MG capsule Take 1 capsule (500 mg  total) by mouth 3 (three) times daily for 5 days. 07/21/24 07/26/24 Yes Corlis Burnard DEL, NP  buPROPion  ER (WELLBUTRIN  SR) 100 MG 12 hr tablet Take 1 tablet (100 mg total) by mouth daily. 02/16/24   Cannady, Jolene T, NP  Cholecalciferol (VITAMIN D3) 50 MCG (2000 UT) TABS Take 1 tablet by mouth daily at 2 PM.    [provider]  Ferrous Sulfate  (IRON) 28 MG TABS Take 1 tablet by mouth daily at 2 PM.    [provider]  norethindrone  (MICRONOR ) 0.35 MG tablet Take 1 tablet (0.35 mg total) by mouth daily. 01/05/24   Cannady, Jolene T, NP  Prenatal Vit-Fe Fumarate-FA (PRENATAL MULTIVITAMIN) TABS tablet Take 1 tablet by mouth daily at 12 noon.    [provider]    Family History Family History  Problem Relation Age of Onset   Hypertension Father    Hyperlipidemia Sister    Rheum arthritis Maternal Grandmother    Osteoporosis Maternal Grandmother    Heart disease Paternal Grandmother    Rheum arthritis Paternal Grandmother  Heart disease Paternal Grandfather    Hypertension Other     Social History Social History   Tobacco Use   Smoking status: Never   Smokeless tobacco: Never  Vaping Use   Vaping status: Never Used  Substance Use Topics   Alcohol use: Yes    Alcohol/week: 1.0 standard drink of alcohol    Types: 1 Cans of beer per week    Comment: occas   Drug use: No     Allergies   Sunscreens   Review of Systems Review of Systems  Constitutional:  Negative for chills and fever.  Musculoskeletal:  Negative for arthralgias and joint swelling.  Skin:  Positive for wound. Negative for color change.  Neurological:  Negative for weakness and numbness.     Physical Exam Triage Vital Signs ED Triage Vitals  Encounter Vitals Group     BP 07/21/24 1309 109/77     Girls Systolic BP Percentile --      Girls Diastolic BP Percentile --      Boys Systolic BP Percentile --      Boys Diastolic BP Percentile --      Pulse Rate 07/21/24 1309 76     Resp  07/21/24 1309 18     Temp 07/21/24 1309 97.7 F (36.5 C)     Temp src --      SpO2 07/21/24 1309 99 %     Weight --      Height --      Head Circumference --      Peak Flow --      Pain Score 07/21/24 1310 3     Pain Loc --      Pain Education --      Exclude from Growth Chart --    No data found.  Updated Vital Signs BP 109/77   Pulse 76   Temp 97.7 F (36.5 C)   Resp 18   LMP 07/03/2024   SpO2 99%   Breastfeeding Yes   Visual Acuity Right Eye Distance:   Left Eye Distance:   Bilateral Distance:    Right Eye Near:   Left Eye Near:    Bilateral Near:     Physical Exam Constitutional:      General: She is not in acute distress. HENT:     Mouth/Throat:     Mouth: Mucous membranes are moist.  Cardiovascular:     Rate and Rhythm: Normal rate and regular rhythm.  Pulmonary:     Effort: Pulmonary effort is normal. No respiratory distress.  Musculoskeletal:        General: No deformity. Normal range of motion.     Comments: Right fifth finger has FROM, strength 5/5, sensation intact, brisk capillary refill.  Skin:    General: Skin is warm and dry.     Capillary Refill: Capillary refill takes less than 2 seconds.     Findings: Lesion present.     Comments: C-shaped flap laceration on right lateral fifth finger.  No drainage or active bleeding.  Wound cleaned and dressed with antibiotic ointment and nonadherent dressing.  Neurological:     General: No focal deficit present.     Mental Status: She is alert.     Sensory: No sensory deficit.     Motor: No weakness.      UC Treatments / Results  Labs (all labs ordered are listed, but only abnormal results are displayed) Labs Reviewed - No data to display  EKG   Radiology No  results found.  Procedures Procedures (including critical care time)  Medications Ordered in UC Medications - No data to display  Initial Impression / Assessment and Plan / UC Course  I have reviewed the triage vital signs and the  nursing notes.  Pertinent labs & imaging results that were available during my care of the patient were reviewed by me and considered in my medical decision making (see chart for details).    Laceration of right little finger, lactating mother.  Afebrile and vital signs are stable.  The laceration occurred yesterday.  No active bleeding or drainage.  Tetanus is up-to-date.  Prophylactically treating with cephalexin  today to prevent infection.  Wound care instructions and signs of infection discussed.  Instructed patient to follow-up right away if she notes signs of infection.  She agrees to plan of care.  Final Clinical Impressions(s) / UC Diagnoses   Final diagnoses:  Laceration of right little finger without foreign body without damage to nail, initial encounter  Lactating mother     Discharge Instructions      Take the cephalexin  as directed.    Keep your wound clean and dry.  Wash it gently twice a day with soap and water.  Then apply an antibiotic cream and bandage.    Follow up right away if you see signs of infection, such as redness, pus-like drainage, fever, or other concerning symptoms.        ED Prescriptions     Medication Sig Dispense Auth. Provider   cephALEXin  (KEFLEX ) 500 MG capsule Take 1 capsule (500 mg total) by mouth 3 (three) times daily for 5 days. 15 capsule Corlis Burnard DEL, NP      PDMP not reviewed this encounter.   Corlis Burnard DEL, NP 07/21/24 1351

## 2024-07-25 ENCOUNTER — Encounter: Payer: Self-pay | Admitting: Nurse Practitioner

## 2024-07-26 NOTE — Telephone Encounter (Signed)
 Scheduled

## 2024-08-17 ENCOUNTER — Ambulatory Visit: Admitting: Nurse Practitioner

## 2024-08-21 NOTE — Patient Instructions (Incomplete)
 Be Involved in Caring For Your Health:  Taking Medications When medications are taken as directed, they can greatly improve your health. But if they are not taken as prescribed, they may not work. In some cases, not taking them correctly can be harmful. To help ensure your treatment remains effective and safe, understand your medications and how to take them. Bring your medications to each visit for review by your provider.  Your lab results, notes, and after visit summary will be available on My Chart. We strongly encourage you to use this feature. If lab results are abnormal the clinic will contact you with the appropriate steps. If the clinic does not contact you assume the results are satisfactory. You can always view your results on My Chart. If you have questions regarding your health or results, please contact the clinic during office hours. You can also ask questions on My Chart.  We at [facility] are grateful that you chose us  to provide your care. We strive to provide evidence-based and compassionate care and are always looking for feedback. If you get a survey from the clinic please complete this so we can hear your opinions.  Healthy Eating, Adult Healthy eating may help you get and keep a healthy body weight, reduce the risk of chronic disease, and live a long and productive life. It is important to follow a healthy eating pattern. Your nutritional and calorie needs should be met mainly by different nutrient-rich foods. What are tips for following this plan? Reading food labels Read labels and choose the following: Reduced or low sodium products. Juices with 100% fruit juice. Foods with low saturated fats (<3 g per serving) and high polyunsaturated and monounsaturated fats. Foods with whole grains, such as whole wheat, cracked wheat, brown rice, and wild rice. Whole grains that are fortified with folic acid. This is recommended for females who are pregnant or who want to  become pregnant. Read labels and do not eat or drink the following: Foods or drinks with added sugars. These include foods that contain brown sugar, corn sweetener, corn syrup, dextrose , fructose, glucose, high-fructose corn syrup, honey, invert sugar, lactose, malt syrup, maltose, molasses, raw sugar, sucrose, trehalose, or turbinado sugar. Limit your intake of added sugars to less than 10% of your total daily calories. Do not eat more than the following amounts of added sugar per day: 6 teaspoons (25 g) for females. 9 teaspoons (38 g) for males. Foods that contain processed or refined starches and grains. Refined grain products, such as white flour, degermed cornmeal, white bread, and white rice. Shopping Choose nutrient-rich snacks, such as vegetables, whole fruits, and nuts. Avoid high-calorie and high-sugar snacks, such as potato chips, fruit snacks, and candy. Use oil-based dressings and spreads on foods instead of solid fats such as butter, margarine, sour cream, or cream cheese. Limit pre-made sauces, mixes, and instant products such as flavored rice, instant noodles, and ready-made pasta. Try more plant-protein sources, such as tofu, tempeh, black beans, edamame, lentils, nuts, and seeds. Explore eating plans such as the Mediterranean diet or vegetarian diet. Try heart-healthy dips made with beans and healthy fats like hummus and guacamole. Vegetables go great with these. Cooking Use oil to saut or stir-fry foods instead of solid fats such as butter, margarine, or lard. Try baking, boiling, grilling, or broiling instead of frying. Remove the fatty part of meats before cooking. Steam vegetables in water  or broth. Meal planning  At meals, imagine dividing your plate into fourths: One-half of  your plate is fruits and vegetables. One-fourth of your plate is whole grains. One-fourth of your plate is protein, especially lean meats, poultry, eggs, tofu, beans, or nuts. Include low-fat  dairy as part of your daily diet. Lifestyle Choose healthy options in all settings, including home, work, school, restaurants, or stores. Prepare your food safely: Wash your hands after handling raw meats. Where you prepare food, keep surfaces clean by regularly washing with hot, soapy water . Keep raw meats separate from ready-to-eat foods, such as fruits and vegetables. Cook seafood, meat, poultry, and eggs to the recommended temperature. Get a food thermometer. Store foods at safe temperatures. In general: Keep cold foods at 84F (4.4C) or below. Keep hot foods at 184F (60C) or above. Keep your freezer at [facility] (-17.8C) or below. Foods are not safe to eat if they have been between the temperatures of 40-184F (4.4-60C) for more than 2 hours. What foods should I eat? Fruits Aim to eat 1-2 cups of fresh, canned (in natural juice), or frozen fruits each day. One cup of fruit equals 1 small apple, 1 large banana, 8 large strawberries, 1 cup (237 g) canned fruit,  cup (82 g) dried fruit, or 1 cup (240 mL) 100% juice. Vegetables Aim to eat 2-4 cups of fresh and frozen vegetables each day, including different varieties and colors. One cup of vegetables equals 1 cup (91 g) broccoli or cauliflower florets, 2 medium carrots, 2 cups (150 g) raw, leafy greens, 1 large tomato, 1 large bell pepper, 1 large sweet potato, or 1 medium white potato. Grains Aim to eat 5-10 ounce-equivalents of whole grains each day. Examples of 1 ounce-equivalent of grains include 1 slice of bread, 1 cup (40 g) ready-to-eat cereal, 3 cups (24 g) popcorn, or  cup (93 g) cooked rice. Meats and other proteins Try to eat 5-7 ounce-equivalents of protein each day. Examples of 1 ounce-equivalent of protein include 1 egg,  oz nuts (12 almonds, 24 pistachios, or 7 walnut halves), 1/4 cup (90 g) cooked beans, 6 tablespoons (90 g) hummus or 1 tablespoon (16 g) peanut butter. A cut of meat or fish that is the size of a deck of  cards is about 3-4 ounce-equivalents (85 g). Of the protein you eat each week, try to have at least 8 sounce (227 g) of seafood. This is about 2 servings per week. This includes salmon, trout, herring, sardines, and anchovies. Dairy Aim to eat 3 cup-equivalents of fat-free or low-fat dairy each day. Examples of 1 cup-equivalent of dairy include 1 cup (240 mL) milk, 8 ounces (250 g) yogurt, 1 ounces (44 g) natural cheese, or 1 cup (240 mL) fortified soy milk. Fats and oils Aim for about 5 teaspoons (21 g) of fats and oils per day. Choose monounsaturated fats, such as canola and olive oils, mayonnaise made with olive oil or avocado oil, avocados, peanut butter, and most nuts, or polyunsaturated fats, such as sunflower, corn, and soybean oils, walnuts, pine nuts, sesame seeds, sunflower seeds, and flaxseed. Beverages Aim for 6 eight-ounce glasses of water  per day. Limit coffee to 3-5 eight-ounce cups per day. Limit caffeinated beverages that have added calories, such as soda and energy drinks. If you drink alcohol: Limit how much you have to: 0-1 drink a day if you are female. 0-2 drinks a day if you are female. Know how much alcohol is in your drink. In the [place] one drink is one 12 oz bottle of beer (355 mL), one 5 oz glass of wine (  148 mL), or one 1 oz glass of hard liquor (44 mL). Seasoning and other foods Try not to add too much salt to your food. Try using herbs and spices instead of salt. Try not to add sugar to food. This information is based on [place] nutrition guidelines. To learn more, visit DisposableNylon.be. Exact amounts may vary. You may need different amounts. This information is not intended to replace advice given to you by your health care provider. Make sure you discuss any questions you have with your health care provider. Document Revised: 08/25/2022 Document Reviewed: 08/25/2022 Elsevier Patient Education  2024 [employer].

## 2024-08-24 ENCOUNTER — Encounter: Admitting: Nurse Practitioner

## 2024-08-24 DIAGNOSIS — Z0000 Encounter for general adult medical examination without abnormal findings: Secondary | ICD-10-CM

## 2024-08-24 DIAGNOSIS — Z23 Encounter for immunization: Secondary | ICD-10-CM

## 2024-08-24 DIAGNOSIS — Z136 Encounter for screening for cardiovascular disorders: Secondary | ICD-10-CM

## 2024-08-24 DIAGNOSIS — J45990 Exercise induced bronchospasm: Secondary | ICD-10-CM

## 2024-08-24 DIAGNOSIS — F419 Anxiety disorder, unspecified: Secondary | ICD-10-CM

## 2024-08-24 NOTE — Telephone Encounter (Signed)
 Ok for E2C2 to review.  Called patient to discuss her appointment reschedule. At this time she will need to speak with her employer to find out when she is able to reschedule. When she does return the call please make every attempt to transfer the call to myself and ask CAL to interrupt me if needed.

## 2024-09-05 ENCOUNTER — Telehealth: Payer: Self-pay | Admitting: Nurse Practitioner

## 2024-09-05 NOTE — Telephone Encounter (Signed)
 Called patient and left a message for her to call back to get scheduled for her physical that was canceled on 9/17. Will need to use a SD spot to get her scheduled per [employer].

## 2024-09-05 NOTE — Telephone Encounter (Signed)
 Copied from CRM #[id number]. Topic: Appointments - Scheduling Inquiry for Clinic >> Sep 05, 2024 10:10 AM Everette C wrote: Reason for CRM: The patient would like to be contacted by a member of practice staff to discuss rescheduling their previously scheduled annual with their PCP. The patient would like to discuss their earliest options to be seen that are agreeable with their work schedule between 8 AM and 2 PM  Please contact further when possible

## 2024-09-14 ENCOUNTER — Encounter: Payer: Self-pay | Admitting: Nurse Practitioner

## 2024-09-14 ENCOUNTER — Ambulatory Visit: Admitting: Nurse Practitioner

## 2024-09-14 VITALS — BP 108/65 | HR 60 | Temp 97.9°F | Resp 15 | Ht 67.99 in | Wt 200.6 lb

## 2024-09-14 DIAGNOSIS — F419 Anxiety disorder, unspecified: Secondary | ICD-10-CM

## 2024-09-14 DIAGNOSIS — Z13220 Encounter for screening for lipoid disorders: Secondary | ICD-10-CM | POA: Diagnosis not present

## 2024-09-14 DIAGNOSIS — I83819 Varicose veins of unspecified lower extremities with pain: Secondary | ICD-10-CM | POA: Diagnosis not present

## 2024-09-14 DIAGNOSIS — Z136 Encounter for screening for cardiovascular disorders: Secondary | ICD-10-CM

## 2024-09-14 DIAGNOSIS — F32A Depression, unspecified: Secondary | ICD-10-CM

## 2024-09-14 DIAGNOSIS — N920 Excessive and frequent menstruation with regular cycle: Secondary | ICD-10-CM | POA: Diagnosis not present

## 2024-09-14 DIAGNOSIS — Z0000 Encounter for general adult medical examination without abnormal findings: Secondary | ICD-10-CM

## 2024-09-14 MED ORDER — NORETHINDRONE 0.35 MG PO TABS: 1.0000 | ORAL_TABLET | Freq: Every day | ORAL | 11 refills | Status: AC

## 2024-09-14 MED ORDER — BUPROPION HCL ER (SR) 100 MG PO TB12: 100.0000 mg | ORAL_TABLET | Freq: Every day | ORAL | 2 refills | Status: DC

## 2024-09-14 NOTE — Assessment & Plan Note (Signed)
 Ongoing most prominent to right upper, anterior thigh since her pregnancies with intermittent inflammation and pain.  Take Tylenol  as needed. Referral to vascular as in future may benefit alternate therapy if any worsening and not responding to OTC treatments.

## 2024-09-14 NOTE — Patient Instructions (Signed)
 Be Involved in Caring For Your Health:  Taking Medications When medications are taken as directed, they can greatly improve your health. But if they are not taken as prescribed, they may not work. In some cases, not taking them correctly can be harmful. To help ensure your treatment remains effective and safe, understand your medications and how to take them. Bring your medications to each visit for review by your provider.  Your lab results, notes, and after visit summary will be available on My Chart. We strongly encourage you to use this feature. If lab results are abnormal the clinic will contact you with the appropriate steps. If the clinic does not contact you assume the results are satisfactory. You can always view your results on My Chart. If you have questions regarding your health or results, please contact the clinic during office hours. You can also ask questions on My Chart.  We at [facility] are grateful that you chose us  to provide your care. We strive to provide evidence-based and compassionate care and are always looking for feedback. If you get a survey from the clinic please complete this so we can hear your opinions.  Managing Depression, Adult Depression is a mental health condition that affects your thoughts, feelings, and actions. Being diagnosed with depression can bring you relief if you did not know why you have felt or behaved a certain way. It could also leave you feeling overwhelmed. Finding ways to manage your symptoms can help you feel more positive about your future. How to manage lifestyle changes Being depressed is difficult. Depression can increase the level of everyday stress. Stress can make depression symptoms worse. You may believe your symptoms cannot be managed or will never improve. However, there are many things you can try to help manage your symptoms. There is hope. Managing stress  Stress is your body's reaction to life changes and events,  both good and bad. Stress can add to your feelings of depression. Learning to manage your stress can help lessen your feelings of depression. Try some of the following approaches to reducing your stress (stress reduction techniques): Listen to music that you enjoy and that inspires you. Try using a meditation app or take a meditation class. Develop a practice that helps you connect with your spiritual self. Walk in nature, pray, or go to a place of worship. Practice deep breathing. To do this, inhale slowly through your nose. Pause at the top of your inhale for a few seconds and then exhale slowly, letting yourself relax. Repeat this three or four times. Practice yoga to help relax and work your muscles. Choose a stress reduction technique that works for you. These techniques take time and practice to develop. Set aside 5-15 minutes a day to do them. Therapists can offer training in these techniques. Do these things to help manage stress: Keep a journal. Know your limits. Set healthy boundaries for yourself and others, such as saying no when you think something is too much. Pay attention to how you react to certain situations. You may not be able to control everything, but you can change your reaction. Add humor to your life by watching funny movies or shows. Make time for activities that you enjoy and that relax you. Spend less time using electronics, especially at night before bed. The light from screens can make your brain think it is time to get up rather than go to bed.  Medicines Medicines, such as antidepressants, are often a part of  treatment for depression. Talk with your pharmacist or health care provider about all the medicines, supplements, and herbal products that you take, their possible side effects, and what medicines and other products are safe to take together. Make sure to report any side effects you may have to your health care provider. Relationships Your health care  provider may suggest family therapy, couples therapy, or individual therapy as part of your treatment. How to recognize changes Everyone responds differently to treatment for depression. As you recover from depression, you may start to: Have more interest in doing activities. Feel more hopeful. Have more energy. Eat a more regular amount of food. Have better mental focus. It is important to recognize if your depression is not getting better or is getting worse. The symptoms you had in the beginning may return, such as: Feeling tired. Eating too much or too little. Sleeping too much or too little. Feeling restless, agitated, or hopeless. Trouble focusing or making decisions. Having unexplained aches and pains. Feeling irritable, angry, or aggressive. If you or your family members notice these symptoms coming back, let your health care provider know right away. Follow these instructions at home: Activity Try to get some form of exercise each day, such as walking. Try yoga, mindfulness, or other stress reduction techniques. Participate in group activities if you are able. Lifestyle Get enough sleep. Cut down on or stop using caffeine, tobacco, alcohol, and any other harmful substances. Eat a healthy diet that includes plenty of vegetables, fruits, whole grains, low-fat dairy products, and lean protein. Limit foods that are high in solid fats, added sugar, or salt (sodium). General instructions Take over-the-counter and prescription medicines only as told by your health care provider. Keep all follow-up visits. It is important for your health care provider to check on your mood, behavior, and medicines. Your health care provider may need to make changes to your treatment. Where to find support Talking to others  Friends and family members can be sources of support and guidance. Talk to trusted friends or family members about your condition. Explain your symptoms and let them know that you  are working with a health care provider to treat your depression. Tell friends and family how they can help. Finances Find mental health providers that fit with your financial situation. Talk with your health care provider if you are worried about access to food, housing, or medicine. Call your insurance company to learn about your co-pays and prescription plan. Where to find more information You can find support in your area from: Anxiety and Depression Association of America (ADAA): adaa.org Mental Health America: mentalhealthamerica.net The [employer] on Mental Illness: nami.org Contact a health care provider if: You stop taking your antidepressant medicines, and you have any of these symptoms: Nausea. Headache. Light-headedness. Chills and body aches. Not being able to sleep (insomnia). You or your friends and family think your depression is getting worse. Get help right away if: You have thoughts of hurting yourself or others. Get help right away if you feel like you may hurt yourself or others, or have thoughts about taking your own life. Go to your nearest emergency room or: Call 911. Call the National Suicide Prevention Lifeline at (743) 630-7432 or 988. This is open 24 hours a day. Text the Crisis Text Line at 854-590-3828. This information is not intended to replace advice given to you by your health care provider. Make sure you discuss any questions you have with your health care provider. Document Revised: 04/01/2022 Document Reviewed:  04/01/2022 Elsevier Patient Education  2024 [employer].

## 2024-09-14 NOTE — Assessment & Plan Note (Signed)
 Chronic, ongoing.  Denies SI/HI.  Tolerating and finding benefit from Wellbutrin  100 MG.  Scores are stable.  Will continue this and adjust as needed.

## 2024-09-14 NOTE — Assessment & Plan Note (Signed)
 Improved with Minipill on board and no ADR presenting.  Will continue this and adjust regimen as needed.

## 2024-09-14 NOTE — Progress Notes (Signed)
 BP 108/65 (BP Location: Left Arm, Patient Position: Sitting, Cuff Size: Normal)   Pulse 60   Temp 97.9 F (36.6 C) (Oral)   Resp 15   Ht 5' 7.99 (1.727 m)   Wt 200 lb 9.6 oz (91 kg)   LMP 08/19/2024 (Exact Date)   SpO2 98%   Breastfeeding No   BMI 30.51 kg/m    Subjective:    Patient ID: Elizabeth Harding, female    DOB: [birth year], 38 y.o.   MRN: [id number]  HPI: Elizabeth Harding is a 38 y.o. female presenting on 09/14/2024 for comprehensive medical examination. Current medical complaints include:none  She currently lives with: husband and children Menopausal Symptoms: no  Takes Micronor  and this has helped her cycles be less heavy, but not regular.    Painful intermittent varicose veins upper right thigh area. Present on an off since pregnancy.  DEPRESSION AND ANXIETY Taking Wellbutrin  daily. Following with therapist, seeing them every other week. Mood status: stable Satisfied with current treatment?: yes Symptom severity: mild  Duration of current treatment : chronic Side effects: no Medication compliance: good compliance Psychotherapy/counseling: yes current Previous psychiatric medications: as above Depressed mood: no Anxious mood: low lying, but able to function daily Anhedonia: no Significant weight loss or gain: no Insomnia: varies Fatigue: varies Feelings of worthlessness or guilt: no Impaired concentration/indecisiveness: no Suicidal ideations: no Hopelessness: no Crying spells: no    09/14/2024    8:38 AM 03/25/2024    8:13 AM 02/16/2024   11:05 AM 01/05/2024   11:16 AM 06/10/2023    9:28 AM  Depression screen PHQ 2/9  Decreased Interest 0 0 0 0 1  Down, Depressed, Hopeless 0 0 0 0 1  PHQ - 2 Score 0 0 0 0 2  Altered sleeping 0 0 0 0 0  Tired, decreased energy 2 2 2 2 3   Change in appetite 0 0 0 1 2  Feeling bad or failure about yourself  0 0 0 0 1  Trouble concentrating 1 0 0 0 0  Moving slowly or fidgety/restless 0 0 0 0 0  Suicidal thoughts 0 0  0 0 0  PHQ-9 Score 3 2 2 3 8   Difficult doing work/chores Not difficult at all Not difficult at all  Not difficult at all Not difficult at all       09/14/2024    8:38 AM 03/25/2024    8:13 AM 02/16/2024   11:06 AM 01/05/2024   11:17 AM  GAD 7 : Generalized Anxiety Score  Nervous, Anxious, on Edge 1 0 0 2  Control/stop worrying 1 0 0 0  Worry too much - different things 0 1 0 0  Trouble relaxing 1 2 2 2   Restless 0 0 0 0  Easily annoyed or irritable 1 1 2 2   Afraid - awful might happen 0 0 0 0  Total GAD 7 Score 4 4 4 6   Anxiety Difficulty  Somewhat difficult  Somewhat difficult      06/10/2023    9:27 AM 01/05/2024   11:16 AM 02/16/2024   11:05 AM 03/25/2024    8:13 AM 09/14/2024    8:37 AM  Fall Risk  Falls in the past year? 0 0 0 0 0  Was there an injury with Fall? 0 0 0 0 0  Fall Risk Category Calculator 0 0 0 0 0  Patient at Risk for Falls Due to No Fall Risks  No Fall Risks No Fall Risks No Fall Risks  Fall risk Follow up Falls evaluation completed  Falls evaluation completed Falls evaluation completed Falls evaluation completed    Past Medical History:  Past Medical History:  Diagnosis Date   Allergic rhinitis due to pollen    hay fever   Allergy    Seasonal   Anxiety    with aggitation and anger   Asthma    Exercise induced, inhaler not often needed   Chicken pox    Depression 2015   GERD (gastroesophageal reflux disease)    During pregnancy   Irregular intermenstrual bleeding    Jaundice    as a newborn   Traumatic injury during pregnancy in third trimester 12/24/2015    Surgical History:  Past Surgical History:  Procedure Laterality Date   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   Ferrous  Sulfate (IRON) 28 MG TABS Take 1 tablet by mouth daily at 2 PM.   Prenatal Vit-Fe Fumarate-FA (PRENATAL MULTIVITAMIN) TABS tablet Take 1 tablet by mouth daily at 12 noon.   No current facility-administered medications on file prior to visit.    Allergies:  Allergies  Allergen Reactions   Sunscreens Rash    Social History:  Social History   Socioeconomic History   Marital status: Married    Spouse name: Not on file   Number of children: Not on file   Years of education: Not on file   Highest education level: Master's degree (e.g., MA, MS, MEng, MEd, MSW, MBA)  Occupational History   Not on file  Tobacco Use   Smoking status: Never   Smokeless tobacco: Never  Vaping Use   Vaping status: Never Used  Substance and Sexual Activity   Alcohol use: Yes    Alcohol/week: 3.0 standard drinks of alcohol    Types: 2 Glasses of wine, 1 Cans of beer per week    Comment: occas   Drug use: No   Sexual activity: Yes    Partners: Male    Birth control/protection: Pill, Surgical    Comment: Vasectomy  Other Topics Concern   Not on file  Social History Narrative   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[place], with trainerDiet - regular   Social Drivers of Health   Financial Resource Strain: Low Risk  (07/04/2024)   Overall Financial Resource Strain (CARDIA)    Difficulty of Paying Living Expenses: Not very hard  Food Insecurity: No Food Insecurity (07/04/2024)   Hunger Vital Sign    Worried About Running Out of Food in the Last Year: Never true    Ran Out of Food in the Last Year: Never true  Transportation Needs: No Transportation Needs (07/04/2024)   PRAPARE - Administrator, Civil Service (Medical): No    Lack of Transportation (Non-Medical): No  Physical Activity: Insufficiently Active (07/04/2024)   Exercise Vital Sign    Days of Exercise per Week: 3 days    Minutes of Exercise per Session: 20 min  Stress: Stress Concern Present (07/04/2024)   [employer] of  Occupational Health - Occupational Stress Questionnaire    Feeling of Stress: Rather much  Social Connections: Moderately Integrated (07/04/2024)   Social Connection and Isolation Panel    Frequency  of Communication with Friends and Family: Once a week    Frequency of Social Gatherings with Friends and Family: Once a week    Attends Religious Services: More than 4 times per year    Active Member of [employer] or Organizations: Yes    Attends Banker Meetings: 1 to 4 times per year    Marital Status: Married  Catering manager Violence: Not At Risk (03/26/2023)   Humiliation, Afraid, Rape, and Kick questionnaire    Fear of Current or Ex-Partner: No    Emotionally Abused: No    Physically Abused: No    Sexually Abused: No   Social History   Tobacco Use  Smoking Status Never  Smokeless Tobacco Never   Social History   Substance and Sexual Activity  Alcohol Use Yes   Alcohol/week: 3.0 standard drinks of alcohol   Types: 2 Glasses of wine, 1 Cans of beer per week   Comment: occas    Family History:  Family History  Problem Relation Age of Onset   Hypertension Father    Hyperlipidemia Sister    Anxiety disorder Sister    Rheum arthritis Maternal Grandmother    Osteoporosis Maternal Grandmother    Heart disease Paternal Grandmother    Rheum arthritis Paternal Grandmother    Heart disease Paternal Grandfather    Hypertension Other     Past medical history, surgical history, medications, allergies, family history and social history reviewed with patient today and changes made to appropriate areas of the chart.   ROS All other ROS negative except what is listed above and in the HPI.      Objective:    BP 108/65 (BP Location: Left Arm, Patient Position: Sitting, Cuff Size: Normal)   Pulse 60   Temp 97.9 F (36.6 C) (Oral)   Resp 15   Ht 5' 7.99 (1.727 m)   Wt 200 lb 9.6 oz (91 kg)   LMP 08/19/2024 (Exact Date)   SpO2 98%   Breastfeeding No   BMI 30.51 kg/m    Wt Readings from Last 3 Encounters:  09/14/24 200 lb 9.6 oz (91 kg)  05/23/24 196 lb 6.4 oz (89.1 kg)  03/25/24 198 lb 12.8 oz (90.2 kg)    Physical Exam Vitals and nursing note reviewed. Exam conducted with a chaperone present.  Constitutional:      General: She is awake. She is not in acute distress.    Appearance: She is well-developed and well-groomed. She is not ill-appearing or toxic-appearing.  HENT:     Head: Normocephalic and atraumatic.     Right Ear: Hearing, tympanic membrane, ear canal and external ear normal. No drainage.     Left Ear: Hearing, tympanic membrane, ear canal and external ear normal. No drainage.     Nose: Nose normal.     Right Sinus: No maxillary sinus tenderness or frontal sinus tenderness.     Left Sinus: No maxillary sinus tenderness or frontal sinus tenderness.     Mouth/Throat:     Mouth: Mucous membranes are moist.     Pharynx: Oropharynx is clear. Uvula midline. No pharyngeal swelling, oropharyngeal exudate or posterior oropharyngeal erythema.  Eyes:     General: Lids are normal.        Right eye: No discharge.        Left eye: No discharge.     Extraocular Movements: Extraocular movements intact.     Conjunctiva/sclera: Conjunctivae normal.     Pupils: Pupils are equal, round, and reactive  to light.     Visual Fields: Right eye visual fields normal and left eye visual fields normal.  Neck:     Thyroid : No thyromegaly.     Vascular: No carotid bruit.     Trachea: Trachea normal.  Cardiovascular:     Rate and Rhythm: Normal rate and regular rhythm.     Heart sounds: Normal heart sounds. No murmur heard.    No gallop.     Comments: Varicose veins most prominent to right upper, anterior thigh. Mild inflammation noted and small amount of bruising. Pulmonary:     Effort: Pulmonary effort is normal. No accessory muscle usage or respiratory distress.     Breath sounds: Normal breath sounds.  Chest:  Breasts:    Right: Normal.     Left:  Normal.  Abdominal:     General: Bowel sounds are normal.     Palpations: Abdomen is soft. There is no hepatomegaly or splenomegaly.     Tenderness: There is no abdominal tenderness.  Musculoskeletal:        General: Normal range of motion.     Cervical back: Normal range of motion and neck supple.     Right lower leg: No edema.     Left lower leg: No edema.  Lymphadenopathy:     Head:     Right side of head: No submental, submandibular, tonsillar, preauricular or posterior auricular adenopathy.     Left side of head: No submental, submandibular, tonsillar, preauricular or posterior auricular adenopathy.     Cervical: No cervical adenopathy.     Upper Body:     Right upper body: No supraclavicular, axillary or pectoral adenopathy.     Left upper body: No supraclavicular, axillary or pectoral adenopathy.  Skin:    General: Skin is warm and dry.     Capillary Refill: Capillary refill takes less than 2 seconds.     Findings: No rash.  Neurological:     Mental Status: She is alert and oriented to person, place, and time.     Gait: Gait is intact.     Deep Tendon Reflexes: Reflexes are normal and symmetric.     Reflex Scores:      Brachioradialis reflexes are 2+ on the right side and 2+ on the left side.      Patellar reflexes are 2+ on the right side and 2+ on the left side. Psychiatric:        Attention and Perception: Attention normal.        Mood and Affect: Mood normal.        Speech: Speech normal.        Behavior: Behavior normal. Behavior is cooperative.        Thought Content: Thought content normal.        Judgment: Judgment normal.    Results for orders placed or performed in visit on 05/23/24  Rapid Strep Screen ([facility] ONLY)   Collection Time: 05/23/24  3:45 PM   Specimen: Other   Other  Release to pati  Result Value Ref Range   Strep Gp A Ag, IA W/Reflex Negative Negative  Culture, Group A Strep   Collection Time: 05/23/24  3:45 PM   Other  Release to  pati  Result Value Ref Range   Strep A Culture Negative       Assessment & Plan:   Problem List Items Addressed This Visit       Cardiovascular and Mediastinum   Varicose veins with  pain   Ongoing most prominent to right upper, anterior thigh since her pregnancies with intermittent inflammation and pain.  Take Tylenol  as needed. Referral to vascular as in future may benefit alternate therapy if any worsening and not responding to OTC treatments.      Relevant Orders   Ambulatory referral to Vascular Surgery     Other   Menorrhagia with regular cycle   Improved with Minipill on board and no ADR presenting.  Will continue this and adjust regimen as needed.      Relevant Orders   Comprehensive metabolic panel with GFR   TSH   Anxiety and depression - Primary   Chronic, ongoing.  Denies SI/HI.  Tolerating and finding benefit from Wellbutrin  100 MG.  Scores are stable.  Will continue this and adjust as needed.       Relevant Medications   buPROPion  ER (WELLBUTRIN  SR) 100 MG 12 hr tablet   Other Relevant Orders   CBC with Differential/Platelet   Comprehensive metabolic panel with GFR   Lipid Panel w/o Chol/HDL Ratio   TSH   Other Visit Diagnoses       Encounter for lipid screening for cardiovascular disease       Lipid panel today.   Relevant Orders   Lipid Panel w/o Chol/HDL Ratio     Encounter for annual physical exam       Annual physical today, health maintenance reviewed.        Follow up plan: Return in about 6 months (around 03/15/2025) for Depression, ANXIETY -- virtual.   LABORATORY TESTING:  - Pap smear: up to date  IMMUNIZATIONS:   - Tdap: Tetanus vaccination status reviewed: last tetanus booster within 10 years. - Influenza: Refused - Pneumovax: Not applicable - Prevnar: Not applicable - COVID: will get in future - HPV: Not applicable - Shingrix vaccine: Not applicable  SCREENING: -Mammogram: Not applicable  - Colonoscopy: Not applicable  -  Bone Density: Not applicable  -Hearing Test: Done elsewhere  -Spirometry: Not applicable   PATIENT COUNSELING:   Advised to take 1 mg of folate supplement per day if capable of pregnancy.   Sexuality: Discussed sexually transmitted diseases, partner selection, use of condoms, avoidance of unintended pregnancy  and contraceptive alternatives.   Advised to avoid cigarette smoking.  I discussed with the patient that most people either abstain from alcohol or drink within safe limits (<=14/week and <=4 drinks/occasion for males, <=7/weeks and <= 3 drinks/occasion for females) and that the risk for alcohol disorders and other health effects rises proportionally with the number of drinks per week and how often a drinker exceeds daily limits.  Discussed cessation/primary prevention of drug use and availability of treatment for abuse.   Diet: Encouraged to adjust caloric intake to maintain  or achieve ideal body weight, to reduce intake of dietary saturated fat and total fat, to limit sodium intake by avoiding high sodium foods and not adding table salt, and to maintain adequate dietary potassium and calcium preferably from fresh fruits, vegetables, and low-fat dairy products.    stressed the importance of regular exercise  Injury prevention: Discussed safety belts, safety helmets, smoke detector, smoking near bedding or upholstery.   Dental health: Discussed importance of regular tooth brushing, flossing, and dental visits.    NEXT PREVENTATIVE PHYSICAL DUE IN 1 YEAR. Return in about 6 months (around 03/15/2025) for Depression, ANXIETY -- virtual.

## 2024-09-15 ENCOUNTER — Ambulatory Visit: Payer: Self-pay | Admitting: Nurse Practitioner

## 2024-09-15 LAB — CBC WITH DIFFERENTIAL/PLATELET
Basophils Absolute: 0.1 x10E3/uL (ref 0.0–0.2)
Basos: 1 %
EOS (ABSOLUTE): 0.2 x10E3/uL (ref 0.0–0.4)
Eos: 3 %
Hematocrit: 38.6 % (ref 34.0–46.6)
Hemoglobin: 12.4 g/dL (ref 11.1–15.9)
Immature Grans (Abs): 0 x10E3/uL (ref 0.0–0.1)
Immature Granulocytes: 0 %
Lymphocytes Absolute: 1.1 x10E3/uL (ref 0.7–3.1)
Lymphs: 17 %
MCH: 29.9 pg (ref 26.6–33.0)
MCHC: 32.1 g/dL (ref 31.5–35.7)
MCV: 93 fL (ref 79–97)
Monocytes Absolute: 0.6 x10E3/uL (ref 0.1–0.9)
Monocytes: 10 %
Neutrophils Absolute: 4.5 x10E3/uL (ref 1.4–7.0)
Neutrophils: 69 %
Platelets: 283 x10E3/uL (ref 150–450)
RBC: 4.15 x10E6/uL (ref 3.77–5.28)
RDW: 12.2 % (ref 11.7–15.4)
WBC: 6.4 x10E3/uL (ref 3.4–10.8)

## 2024-09-15 LAB — COMPREHENSIVE METABOLIC PANEL WITH GFR
ALT: 14 IU/L (ref 0–32)
AST: 18 IU/L (ref 0–40)
Albumin: 4.7 g/dL (ref 3.9–4.9)
Alkaline Phosphatase: 62 IU/L (ref 41–116)
BUN/Creatinine Ratio: 11 (ref 9–23)
BUN: 9 mg/dL (ref 6–20)
Bilirubin Total: 0.4 mg/dL (ref 0.0–1.2)
CO2: 25 mmol/L (ref 20–29)
Calcium: 9.2 mg/dL (ref 8.7–10.2)
Chloride: 102 mmol/L (ref 96–106)
Creatinine, Ser: 0.85 mg/dL (ref 0.57–1.00)
Globulin, Total: 1.9 g/dL (ref 1.5–4.5)
Glucose: 77 mg/dL (ref 70–99)
Potassium: 4 mmol/L (ref 3.5–5.2)
Sodium: 139 mmol/L (ref 134–144)
Total Protein: 6.6 g/dL (ref 6.0–8.5)
eGFR: 90 mL/min/1.73 (ref >59)

## 2024-09-15 LAB — LIPID PANEL W/O CHOL/HDL RATIO
Cholesterol, Total: 146 mg/dL (ref 100–199)
HDL: 54 mg/dL (ref >39)
LDL Chol Calc (NIH): 78 mg/dL (ref 0–99)
Triglycerides: 71 mg/dL (ref 0–149)
VLDL Cholesterol Cal: 14 mg/dL (ref 5–40)

## 2024-09-15 LAB — TSH: TSH: 0.899 u[IU]/mL (ref 0.450–4.500)

## 2024-09-15 NOTE — Progress Notes (Signed)
 Contacted via MyChart  Good afternoon Ayssa, your labs have returned and everything looks fantastic.  No changes needed.  Great job!! Any questions? Keep being amazing!!  Thank you for allowing me to participate in your care.  I appreciate you. Kindest regards, Kaylany Tesoriero

## 2024-10-10 ENCOUNTER — Encounter: Admitting: Nurse Practitioner

## 2024-10-11 ENCOUNTER — Other Ambulatory Visit (INDEPENDENT_AMBULATORY_CARE_PROVIDER_SITE_OTHER): Payer: Self-pay | Admitting: Nurse Practitioner

## 2024-10-11 DIAGNOSIS — I83819 Varicose veins of unspecified lower extremities with pain: Secondary | ICD-10-CM

## 2024-10-12 ENCOUNTER — Encounter (INDEPENDENT_AMBULATORY_CARE_PROVIDER_SITE_OTHER)

## 2024-10-12 ENCOUNTER — Encounter (INDEPENDENT_AMBULATORY_CARE_PROVIDER_SITE_OTHER): Payer: Self-pay | Admitting: Nurse Practitioner

## 2024-10-19 ENCOUNTER — Encounter: Admitting: Nurse Practitioner

## 2024-11-04 ENCOUNTER — Encounter: Payer: Self-pay | Admitting: Nurse Practitioner

## 2024-11-11 ENCOUNTER — Encounter (INDEPENDENT_AMBULATORY_CARE_PROVIDER_SITE_OTHER): Payer: Self-pay | Admitting: Nurse Practitioner

## 2024-11-11 ENCOUNTER — Encounter (INDEPENDENT_AMBULATORY_CARE_PROVIDER_SITE_OTHER)

## 2024-11-17 ENCOUNTER — Encounter: Payer: Self-pay | Admitting: Nurse Practitioner

## 2024-11-17 NOTE — Patient Instructions (Signed)
 Stye A stye, also known as a hordeolum, is a bump that forms on an eyelid. It may look like a pimple next to the eyelash. A stye can form inside the eyelid (internal stye) or outside the eyelid (external stye). A stye can cause redness, swelling, and pain on the eyelid. Styes are very common. Anyone can get them at any age. They usually occur in just one eye at a time, but you may have more than one in either eye. What are the causes? A stye is caused by an infection. The infection is almost always caused by bacteria called Staphylococcus aureus. This is a common type of bacteria that lives on the skin. An internal stye may result from an infected oil-producing gland inside the eyelid. An external stye may be caused by an infection at the base of the eyelash (hair follicle). What increases the risk? You are more likely to develop a stye if: You have had a stye before. You have any of these conditions: Red, itchy, inflamed eyelids (blepharitis). A skin condition such as seborrheic dermatitis or rosacea. High fat levels in your blood (lipids). Dry eyes. What are the signs or symptoms? The most common symptom of a stye is eyelid pain. Internal styes are more painful than external styes. Other symptoms may include: Painful swelling of your eyelid. A scratchy feeling in your eye. Tearing and redness of your eye. A pimple-like bump on the edge of the eyelid. Pus draining from the stye. How is this diagnosed? Your health care provider may be able to diagnose a stye just by examining your eye. The health care provider may also check to make sure: You do not have a fever or other signs of a more serious infection. The infection has not spread to other parts of your eye or areas around your eye. How is this treated? Most styes will clear up in a few days without treatment or with warm compresses applied to the area. You may need to use antibiotic drops or ointment to treat an infection. Sometimes,  steroid drops or ointment are used in addition to antibiotics. In some cases, your health care provider may give you a small steroid injection in the eyelid. If your stye does not heal with routine treatment, your health care provider may drain pus from the stye using a thin blade or needle. This may be done if the stye is large, causing a lot of pain, or affecting your vision. Follow these instructions at home: Take over-the-counter and prescription medicines only as told by your health care provider. This includes eye drops or ointments. If you were prescribed an antibiotic medicine, steroid medicine, or both, apply or use them as told by your health care provider. Do not stop using the medicine even if your condition improves. Apply a warm, wet cloth (warm compress) to your eye for 5-10 minutes, 4 to 6 times a day. Clean the affected eyelid as directed by your health care provider. Do not wear contact lenses or eye makeup until your stye has healed and your health care provider says that it is safe. Do not try to pop or drain the stye. Do not rub your eye. Contact a health care provider if: You have chills or a fever. Your stye does not go away after several days. Your stye affects your vision. Your eyeball becomes swollen, red, or painful. Get help right away if: You have pain when moving your eye around. Summary A stye is a bump that forms  on an eyelid. It may look like a pimple next to the eyelash. A stye can form inside the eyelid (internal stye) or outside the eyelid (external stye). A stye can cause redness, swelling, and pain on the eyelid. Your health care provider may be able to diagnose a stye just by examining your eye. Apply a warm, wet cloth (warm compress) to your eye for 5-10 minutes, 4 to 6 times a day. This information is not intended to replace advice given to you by your health care provider. Make sure you discuss any questions you have with your health care  provider. Document Revised: 01/30/2021 Document Reviewed: 01/30/2021 Elsevier Patient Education  2024 [employer].

## 2024-11-17 NOTE — Telephone Encounter (Signed)
 Called patient and left a message to call back to get scheduled.

## 2024-11-18 ENCOUNTER — Telehealth: Admitting: Nurse Practitioner

## 2024-11-18 ENCOUNTER — Encounter: Payer: Self-pay | Admitting: Nurse Practitioner

## 2024-11-18 DIAGNOSIS — H00022 Hordeolum internum right lower eyelid: Secondary | ICD-10-CM | POA: Insufficient documentation

## 2024-11-18 MED ORDER — ERYTHROMYCIN 5 MG/GM OP OINT: 1.0000 | TOPICAL_OINTMENT | Freq: Every day | OPHTHALMIC | 0 refills | Status: AC

## 2024-11-18 NOTE — Progress Notes (Signed)
 There were no vitals taken for this visit.   Subjective:    Patient ID: Elizabeth Harding, female    DOB: 04-29-1986, 38 y.o.   MRN: [id number]  HPI: Elizabeth Harding is a 38 y.o. female  Chief Complaint  Patient presents with   Stye    Patient states she has a stye on her R eye that she has noticed since Monday. States that the area is sore.    Virtual Visit via Video Note  I connected with [name] on 11/18/2024 at  2:20 PM EST by a video enabled telemedicine application and verified that I am speaking with the correct person using two identifiers.  Location: Patient: home Provider: work   I discussed the limitations of evaluation and management by telemedicine and the availability of in person appointments. The patient expressed understanding and agreed to proceed.  I discussed the assessment and treatment plan with the patient. The patient was provided an opportunity to ask questions and all were answered. The patient agreed with the plan and demonstrated an understanding of the instructions.   The patient was advised to call back or seek an in-person evaluation if the symptoms worsen or if the condition fails to improve as anticipated.  I provided 25 minutes of non-face-to-face time during this encounter.   [name] T Cleston Lautner, NP   STYE RIGHT EYE Has been present to right eye since Monday. Has been doing warm compresses and avoiding use of her contacts. Duration:  days Involved eye:  right Onset: sudden Severity: 6/10 better today Quality: dull, aching, and throbbing Foreign body sensation:a little bit Visual impairment: no Eye redness: yes Discharge: yes Crusting or matting of eyelids: yes Swelling: yes Photophobia: no Itching: yes Tearing: yes Headache: yes Floaters: no URI symptoms: no Contact lens use: yes Close contacts with similar problems: no Eye trauma: no Aggravating factors: unknown Alleviating factors: nothing Status:  fluctuating Treatments attempted: as above  Relevant past medical, surgical, family and social history reviewed and updated as indicated. Interim medical history since our last visit reviewed. Allergies and medications reviewed and updated.  Review of Systems  Constitutional:  Negative for activity change, appetite change, diaphoresis, fatigue and fever.  Eyes:  Positive for pain, discharge, redness and itching. Negative for photophobia and visual disturbance.  Respiratory:  Negative for cough, chest tightness, shortness of breath and wheezing.   Cardiovascular:  Negative for chest pain, palpitations and leg swelling.  Gastrointestinal: Negative.   Neurological: Negative.   Psychiatric/Behavioral: Negative.      Per HPI unless specifically indicated above     Objective:    There were no vitals taken for this visit.  Wt Readings from Last 3 Encounters:  09/14/24 200 lb 9.6 oz (91 kg)  05/23/24 196 lb 6.4 oz (89.1 kg)  03/25/24 198 lb 12.8 oz (90.2 kg)    Physical Exam Vitals and nursing note reviewed.  Constitutional:      General: She is awake. She is not in acute distress.    Appearance: She is well-developed. She is not ill-appearing.  HENT:     Head: Normocephalic.     Right Ear: Hearing normal.     Left Ear: Hearing normal.  Eyes:     General: Lids are normal.        Right eye: Discharge and hordeolum (lower inside lid) present.        Left eye: No discharge.     Extraocular Movements: Extraocular movements intact.     Conjunctiva/sclera:  Right eye: Right conjunctiva is injected. No exudate.    Comments: No external rashes noted.  Pulmonary:     Effort: Pulmonary effort is normal. No accessory muscle usage or respiratory distress.  Musculoskeletal:     Cervical back: Normal range of motion.  Neurological:     Mental Status: She is alert and oriented to person, place, and time.  Psychiatric:        Attention and Perception: Attention normal.        Mood and  Affect: Mood normal.        Behavior: Behavior normal. Behavior is cooperative.        Thought Content: Thought content normal.        Judgment: Judgment normal.     Results for orders placed or performed in visit on 09/14/24  CBC with Differential/Platelet   Collection Time: 09/14/24  9:38 AM  Result Value Ref Range   WBC 6.4 3.4 - 10.8 x10E3/uL   RBC 4.15 3.77 - 5.28 x10E6/uL   Hemoglobin 12.4 11.1 - 15.9 g/dL   Hematocrit 61.3 65.9 - 46.6 %   MCV 93 79 - 97 fL   MCH 29.9 26.6 - 33.0 pg   MCHC 32.1 31.5 - 35.7 g/dL   RDW 87.7 88.2 - 84.5 %   Platelets 283 150 - 450 x10E3/uL   Neutrophils 69 Not Estab. %   Lymphs 17 Not Estab. %   Monocytes 10 Not Estab. %   Eos 3 Not Estab. %   Basos 1 Not Estab. %   Neutrophils Absolute 4.5 1.4 - 7.0 x10E3/uL   Lymphocytes Absolute 1.1 0.7 - 3.1 x10E3/uL   Monocytes Absolute 0.6 0.1 - 0.9 x10E3/uL   EOS (ABSOLUTE) 0.2 0.0 - 0.4 x10E3/uL   Basophils Absolute 0.1 0.0 - 0.2 x10E3/uL   Immature Granulocytes 0 Not Estab. %   Immature Grans (Abs) 0.0 0.0 - 0.1 x10E3/uL  Comprehensive metabolic panel with GFR   Collection Time: 09/14/24  9:38 AM  Result Value Ref Range   Glucose 77 70 - 99 mg/dL   BUN 9 6 - 20 mg/dL   Creatinine, Ser 9.14 0.57 - 1.00 mg/dL   eGFR 90 >40 fO/fpw/8.26   BUN/Creatinine Ratio 11 9 - 23   Sodium 139 134 - 144 mmol/L   Potassium 4.0 3.5 - 5.2 mmol/L   Chloride 102 96 - 106 mmol/L   CO2 25 20 - 29 mmol/L   Calcium 9.2 8.7 - 10.2 mg/dL   Total Protein 6.6 6.0 - 8.5 g/dL   Albumin 4.7 3.9 - 4.9 g/dL   Globulin, Total 1.9 1.5 - 4.5 g/dL   Bilirubin Total 0.4 0.0 - 1.2 mg/dL   Alkaline Phosphatase 62 41 - 116 IU/L   AST 18 0 - 40 IU/L   ALT 14 0 - 32 IU/L  Lipid Panel w/o Chol/HDL Ratio   Collection Time: 09/14/24  9:38 AM  Result Value Ref Range   Cholesterol, Total 146 100 - 199 mg/dL   Triglycerides 71 0 - 149 mg/dL   HDL 54 >60 mg/dL   VLDL Cholesterol Cal 14 5 - 40 mg/dL   LDL Chol Calc (NIH) 78 0 - 99  mg/dL  TSH   Collection Time: 09/14/24  9:38 AM  Result Value Ref Range   TSH 0.899 0.450 - 4.500 uIU/mL      Assessment & Plan:   Problem List Items Addressed This Visit       Musculoskeletal and Integument   Hordeolum  internum of right lower eyelid - Primary   Acute for 5 days, no benefit from warm compresses or avoidance of contact use. Will send in Erythromycin ointment to place right eye lower lid at night, educated her on this. Continue compresses and to avoid contact use until symptoms improved. She has in office visit on Monday, will recheck then. May take Ibuprofen  as needed for pain.        Follow up plan: Return for scheduled on Monday.

## 2024-11-18 NOTE — Assessment & Plan Note (Addendum)
 Acute for 5 days, no benefit from warm compresses or avoidance of contact use. Will send in Erythromycin ointment to place right eye lower lid at night, educated her on this. Continue compresses and to avoid contact use until symptoms improved. She has in office visit on Monday, will recheck then. May take Ibuprofen  as needed for pain.

## 2024-11-19 NOTE — Patient Instructions (Signed)
 Be Involved in Caring For Your Health:  Taking Medications When medications are taken as directed, they can greatly improve your health. But if they are not taken as prescribed, they may not work. In some cases, not taking them correctly can be harmful. To help ensure your treatment remains effective and safe, understand your medications and how to take them. Bring your medications to each visit for review by your provider.  Your lab results, notes, and after visit summary will be available on My Chart. We strongly encourage you to use this feature. If lab results are abnormal the clinic will contact you with the appropriate steps. If the clinic does not contact you assume the results are satisfactory. You can always view your results on My Chart. If you have questions regarding your health or results, please contact the clinic during office hours. You can also ask questions on My Chart.  We at [facility] are grateful that you chose us  to provide your care. We strive to provide evidence-based and compassionate care and are always looking for feedback. If you get a survey from the clinic please complete this so we can hear your opinions.  Healthy Eating, Adult Healthy eating may help you get and keep a healthy body weight, reduce the risk of chronic disease, and live a long and productive life. It is important to follow a healthy eating pattern. Your nutritional and calorie needs should be met mainly by different nutrient-rich foods. What are tips for following this plan? Reading food labels Read labels and choose the following: Reduced or low sodium products. Juices with 100% fruit juice. Foods with low saturated fats (<3 g per serving) and high polyunsaturated and monounsaturated fats. Foods with whole grains, such as whole wheat, cracked wheat, brown rice, and wild rice. Whole grains that are fortified with folic acid. This is recommended for females who are pregnant or who want to  become pregnant. Read labels and do not eat or drink the following: Foods or drinks with added sugars. These include foods that contain brown sugar, corn sweetener, corn syrup, dextrose , fructose, glucose, high-fructose corn syrup, honey, invert sugar, lactose, malt syrup, maltose, molasses, raw sugar, sucrose, trehalose, or turbinado sugar. Limit your intake of added sugars to less than 10% of your total daily calories. Do not eat more than the following amounts of added sugar per day: 6 teaspoons (25 g) for females. 9 teaspoons (38 g) for males. Foods that contain processed or refined starches and grains. Refined grain products, such as white flour, degermed cornmeal, white bread, and white rice. Shopping Choose nutrient-rich snacks, such as vegetables, whole fruits, and nuts. Avoid high-calorie and high-sugar snacks, such as potato chips, fruit snacks, and candy. Use oil-based dressings and spreads on foods instead of solid fats such as butter, margarine, sour cream, or cream cheese. Limit pre-made sauces, mixes, and instant products such as flavored rice, instant noodles, and ready-made pasta. Try more plant-protein sources, such as tofu, tempeh, black beans, edamame, lentils, nuts, and seeds. Explore eating plans such as the Mediterranean diet or vegetarian diet. Try heart-healthy dips made with beans and healthy fats like hummus and guacamole. Vegetables go great with these. Cooking Use oil to saut or stir-fry foods instead of solid fats such as butter, margarine, or lard. Try baking, boiling, grilling, or broiling instead of frying. Remove the fatty part of meats before cooking. Steam vegetables in water  or broth. Meal planning  At meals, imagine dividing your plate into fourths: One-half of  your plate is fruits and vegetables. One-fourth of your plate is whole grains. One-fourth of your plate is protein, especially lean meats, poultry, eggs, tofu, beans, or nuts. Include low-fat  dairy as part of your daily diet. Lifestyle Choose healthy options in all settings, including home, work, school, restaurants, or stores. Prepare your food safely: Wash your hands after handling raw meats. Where you prepare food, keep surfaces clean by regularly washing with hot, soapy water . Keep raw meats separate from ready-to-eat foods, such as fruits and vegetables. Cook seafood, meat, poultry, and eggs to the recommended temperature. Get a food thermometer. Store foods at safe temperatures. In general: Keep cold foods at 84F (4.4C) or below. Keep hot foods at 184F (60C) or above. Keep your freezer at [facility] (-17.8C) or below. Foods are not safe to eat if they have been between the temperatures of 40-184F (4.4-60C) for more than 2 hours. What foods should I eat? Fruits Aim to eat 1-2 cups of fresh, canned (in natural juice), or frozen fruits each day. One cup of fruit equals 1 small apple, 1 large banana, 8 large strawberries, 1 cup (237 g) canned fruit,  cup (82 g) dried fruit, or 1 cup (240 mL) 100% juice. Vegetables Aim to eat 2-4 cups of fresh and frozen vegetables each day, including different varieties and colors. One cup of vegetables equals 1 cup (91 g) broccoli or cauliflower florets, 2 medium carrots, 2 cups (150 g) raw, leafy greens, 1 large tomato, 1 large bell pepper, 1 large sweet potato, or 1 medium white potato. Grains Aim to eat 5-10 ounce-equivalents of whole grains each day. Examples of 1 ounce-equivalent of grains include 1 slice of bread, 1 cup (40 g) ready-to-eat cereal, 3 cups (24 g) popcorn, or  cup (93 g) cooked rice. Meats and other proteins Try to eat 5-7 ounce-equivalents of protein each day. Examples of 1 ounce-equivalent of protein include 1 egg,  oz nuts (12 almonds, 24 pistachios, or 7 walnut halves), 1/4 cup (90 g) cooked beans, 6 tablespoons (90 g) hummus or 1 tablespoon (16 g) peanut butter. A cut of meat or fish that is the size of a deck of  cards is about 3-4 ounce-equivalents (85 g). Of the protein you eat each week, try to have at least 8 sounce (227 g) of seafood. This is about 2 servings per week. This includes salmon, trout, herring, sardines, and anchovies. Dairy Aim to eat 3 cup-equivalents of fat-free or low-fat dairy each day. Examples of 1 cup-equivalent of dairy include 1 cup (240 mL) milk, 8 ounces (250 g) yogurt, 1 ounces (44 g) natural cheese, or 1 cup (240 mL) fortified soy milk. Fats and oils Aim for about 5 teaspoons (21 g) of fats and oils per day. Choose monounsaturated fats, such as canola and olive oils, mayonnaise made with olive oil or avocado oil, avocados, peanut butter, and most nuts, or polyunsaturated fats, such as sunflower, corn, and soybean oils, walnuts, pine nuts, sesame seeds, sunflower seeds, and flaxseed. Beverages Aim for 6 eight-ounce glasses of water  per day. Limit coffee to 3-5 eight-ounce cups per day. Limit caffeinated beverages that have added calories, such as soda and energy drinks. If you drink alcohol: Limit how much you have to: 0-1 drink a day if you are female. 0-2 drinks a day if you are female. Know how much alcohol is in your drink. In the [place] one drink is one 12 oz bottle of beer (355 mL), one 5 oz glass of wine (  148 mL), or one 1 oz glass of hard liquor (44 mL). Seasoning and other foods Try not to add too much salt to your food. Try using herbs and spices instead of salt. Try not to add sugar to food. This information is based on [place] nutrition guidelines. To learn more, visit [employer]. Exact amounts may vary. You may need different amounts. This information is not intended to replace advice given to you by your health care provider. Make sure you discuss any questions you have with your health care provider. Document Revised: 08/25/2022 Document Reviewed: 08/25/2022 Elsevier Patient Education  2024 [employer].

## 2024-11-21 ENCOUNTER — Encounter: Payer: Self-pay | Admitting: Nurse Practitioner

## 2024-11-21 ENCOUNTER — Ambulatory Visit: Admitting: Nurse Practitioner

## 2024-11-21 VITALS — BP 123/80 | HR 75 | Temp 97.9°F | Resp 14 | Ht 67.99 in | Wt 201.0 lb

## 2024-11-21 DIAGNOSIS — N951 Menopausal and female climacteric states: Secondary | ICD-10-CM | POA: Diagnosis not present

## 2024-11-21 DIAGNOSIS — H00022 Hordeolum internum right lower eyelid: Secondary | ICD-10-CM

## 2024-11-21 NOTE — Assessment & Plan Note (Addendum)
 Is having some symptoms of perimenopause. Will check hormone levels for baseline today, discussed with her that these are not always 100% diagnostic but more symptoms assessment.  We discussed that a majority of women on average go through menopause at age 38 or 56, but the range of ages can be anywhere from 78 to 10 with some outliers in between. Perimenopausal symptoms can start on average 7 to 10 years prior to menopause, so it is within reason for women to start having symptoms around age 37 to 15.  Discussed various supplements to trial including Tumeric, Magnesium , Saffron, Vitamin D , Ashwaghanda.

## 2024-11-21 NOTE — Assessment & Plan Note (Signed)
 Acute and slowly improving. Continue Erythromycin  ointment to place right eye lower lid at night, educated her on this. Continue compresses and to avoid contact use until symptoms improved. May need to return to eye doctor if ongoing. No facial rashes noted.

## 2024-11-21 NOTE — Progress Notes (Signed)
 BP 123/80 (BP Location: Left Arm, Patient Position: Sitting, Cuff Size: Large)   Pulse 75   Temp 97.9 F (36.6 C) (Oral)   Resp 14   Ht 5' 7.99 (1.727 m)   Wt 201 lb (91.2 kg)   LMP 11/04/2024   SpO2 98%   BMI 30.57 kg/m    Subjective:    Patient ID: Elizabeth Harding, female    DOB: 1986-02-10, 38 y.o.   MRN: [id number]  HPI: Elizabeth Harding is a 38 y.o. female  Chief Complaint  Patient presents with   Perimenopause    Follow up on previous discussion and options for natural supplements.    Stye    Right eye   Right eye stye, using erythromycin  ointment and is slowly improving.  PERIMENOPAUSAL SYMPTOMS Started having symptoms a couple months ago. Struggling with weight loss, at baseline could lose more easily with her work-out pattern. Weird leg sensations. Constipation, indigestion, more headaches, and occasional decrease in appetite. Is unsure when her mother went through menopause. Gravida/Para: 5/4 Duration: stable Symptom severity: mild Hot flashes: no Night sweats: no Sleep disturbances: no Vaginal dryness: no Dyspareunia:no Decreased libido: yes Emotional lability: yes Stress incontinence: no Previous HRT/pharmacotherapy: yes -- currently taking Micronor  Hysterectomy: no Average interval between menses: 22 to 31 days Length of menses: 6 to 7 days Flow: since starting birth control it is not super heavy, more moderate flow Dysmenorrhea: no GYN surgery: leep procedure in past  Relevant past medical, surgical, family and social history reviewed and updated as indicated. Interim medical history since our last visit reviewed. Allergies and medications reviewed and updated.  Review of Systems  Constitutional:  Positive for appetite change. Negative for activity change, diaphoresis, fatigue and fever.  Eyes:  Positive for redness and itching. Negative for photophobia, pain, discharge and visual disturbance.  Respiratory:  Negative for cough, chest tightness,  shortness of breath and wheezing.   Cardiovascular:  Negative for chest pain, palpitations and leg swelling.  Gastrointestinal:  Positive for constipation. Negative for abdominal distention, abdominal pain, diarrhea, nausea and vomiting.  Neurological:  Positive for headaches.  Psychiatric/Behavioral: Negative.      Per HPI unless specifically indicated above     Objective:    BP 123/80 (BP Location: Left Arm, Patient Position: Sitting, Cuff Size: Large)   Pulse 75   Temp 97.9 F (36.6 C) (Oral)   Resp 14   Ht 5' 7.99 (1.727 m)   Wt 201 lb (91.2 kg)   LMP 11/04/2024   SpO2 98%   BMI 30.57 kg/m   Wt Readings from Last 3 Encounters:  11/21/24 201 lb (91.2 kg)  09/14/24 200 lb 9.6 oz (91 kg)  05/23/24 196 lb 6.4 oz (89.1 kg)    Physical Exam Vitals and nursing note reviewed.  Constitutional:      General: She is awake. She is not in acute distress.    Appearance: She is well-developed and well-groomed. She is obese. She is not ill-appearing or toxic-appearing.  HENT:     Head: Normocephalic.     Right Ear: Hearing and external ear normal.     Left Ear: Hearing and external ear normal.  Eyes:     General: Lids are normal.        Right eye: Hordeolum (less red and appears smaller in size today) present. No discharge.        Left eye: No discharge or hordeolum.     Extraocular Movements: Extraocular movements intact.  Conjunctiva/sclera: Conjunctivae normal.     Pupils: Pupils are equal, round, and reactive to light.  Neck:     Thyroid : No thyromegaly.     Vascular: No carotid bruit.  Cardiovascular:     Rate and Rhythm: Normal rate and regular rhythm.     Heart sounds: Normal heart sounds. No murmur heard.    No gallop.  Pulmonary:     Effort: Pulmonary effort is normal. No accessory muscle usage or respiratory distress.     Breath sounds: Normal breath sounds.  Abdominal:     General: Bowel sounds are normal. There is no distension.     Palpations: Abdomen is  soft.     Tenderness: There is no abdominal tenderness.  Musculoskeletal:     Cervical back: Normal range of motion and neck supple.     Right lower leg: No edema.     Left lower leg: No edema.  Lymphadenopathy:     Cervical: No cervical adenopathy.  Skin:    General: Skin is warm and dry.  Neurological:     Mental Status: She is alert and oriented to person, place, and time.     Deep Tendon Reflexes: Reflexes are normal and symmetric.     Reflex Scores:      Brachioradialis reflexes are 2+ on the right side and 2+ on the left side.      Patellar reflexes are 2+ on the right side and 2+ on the left side. Psychiatric:        Attention and Perception: Attention normal.        Mood and Affect: Mood normal.        Speech: Speech normal.        Behavior: Behavior normal. Behavior is cooperative.        Thought Content: Thought content normal.     Results for orders placed or performed in visit on 09/14/24  CBC with Differential/Platelet   Collection Time: 09/14/24  9:38 AM  Result Value Ref Range   WBC 6.4 3.4 - 10.8 x10E3/uL   RBC 4.15 3.77 - 5.28 x10E6/uL   Hemoglobin 12.4 11.1 - 15.9 g/dL   Hematocrit 61.3 65.9 - 46.6 %   MCV 93 79 - 97 fL   MCH 29.9 26.6 - 33.0 pg   MCHC 32.1 31.5 - 35.7 g/dL   RDW 87.7 88.2 - 84.5 %   Platelets 283 150 - 450 x10E3/uL   Neutrophils 69 Not Estab. %   Lymphs 17 Not Estab. %   Monocytes 10 Not Estab. %   Eos 3 Not Estab. %   Basos 1 Not Estab. %   Neutrophils Absolute 4.5 1.4 - 7.0 x10E3/uL   Lymphocytes Absolute 1.1 0.7 - 3.1 x10E3/uL   Monocytes Absolute 0.6 0.1 - 0.9 x10E3/uL   EOS (ABSOLUTE) 0.2 0.0 - 0.4 x10E3/uL   Basophils Absolute 0.1 0.0 - 0.2 x10E3/uL   Immature Granulocytes 0 Not Estab. %   Immature Grans (Abs) 0.0 0.0 - 0.1 x10E3/uL  Comprehensive metabolic panel with GFR   Collection Time: 09/14/24  9:38 AM  Result Value Ref Range   Glucose 77 70 - 99 mg/dL   BUN 9 6 - 20 mg/dL   Creatinine, Ser 9.14 0.57 - 1.00 mg/dL    eGFR 90 >40 fO/fpw/8.26   BUN/Creatinine Ratio 11 9 - 23   Sodium 139 134 - 144 mmol/L   Potassium 4.0 3.5 - 5.2 mmol/L   Chloride 102 96 - 106 mmol/L   CO2  25 20 - 29 mmol/L   Calcium 9.2 8.7 - 10.2 mg/dL   Total Protein 6.6 6.0 - 8.5 g/dL   Albumin 4.7 3.9 - 4.9 g/dL   Globulin, Total 1.9 1.5 - 4.5 g/dL   Bilirubin Total 0.4 0.0 - 1.2 mg/dL   Alkaline Phosphatase 62 41 - 116 IU/L   AST 18 0 - 40 IU/L   ALT 14 0 - 32 IU/L  Lipid Panel w/o Chol/HDL Ratio   Collection Time: 09/14/24  9:38 AM  Result Value Ref Range   Cholesterol, Total 146 100 - 199 mg/dL   Triglycerides 71 0 - 149 mg/dL   HDL 54 >60 mg/dL   VLDL Cholesterol Cal 14 5 - 40 mg/dL   LDL Chol Calc (NIH) 78 0 - 99 mg/dL  TSH   Collection Time: 09/14/24  9:38 AM  Result Value Ref Range   TSH 0.899 0.450 - 4.500 uIU/mL      Assessment & Plan:   Problem List Items Addressed This Visit       Musculoskeletal and Integument   Hordeolum internum of right lower eyelid   Acute and slowly improving. Continue Erythromycin  ointment to place right eye lower lid at night, educated her on this. Continue compresses and to avoid contact use until symptoms improved. May need to return to eye doctor if ongoing. No facial rashes noted.        Other   Perimenopause - Primary   Is having some symptoms of perimenopause. Will check hormone levels for baseline today, discussed with her that these are not always 100% diagnostic but more symptoms assessment.  We discussed that a majority of women on average go through menopause at age 7 or 26, but the range of ages can be anywhere from 76 to 62 with some outliers in between. Perimenopausal symptoms can start on average 7 to 10 years prior to menopause, so it is within reason for women to start having symptoms around age 75 to 24.  Discussed various supplements to trial including Tumeric, Magnesium , Saffron, Vitamin D , Ashwaghanda.         Relevant Orders   FSH/LH   Estrogens ,  total   Progesterone      Follow up plan: Return for as scheduled .

## 2024-11-22 ENCOUNTER — Ambulatory Visit: Payer: Self-pay | Admitting: Nurse Practitioner

## 2024-11-22 NOTE — Progress Notes (Signed)
 Contacted via MyChart  Hormone levels are still showing regular cycles, but remember how we discussed that does not mean changes are not happening. We will continue to monitor symptoms. Any questions? Waiting on one more lab and if any issues will let you know. Merry Christmas!!

## 2024-11-23 LAB — FSH/LH
FSH: 3.8 m[IU]/mL
LH: 4.3 m[IU]/mL

## 2024-11-23 LAB — ESTROGENS, TOTAL: Estrogen: 495 pg/mL

## 2024-11-23 LAB — PROGESTERONE: Progesterone: 9.3 ng/mL

## 2024-12-13 ENCOUNTER — Ambulatory Visit: Admitting: Nurse Practitioner

## 2024-12-14 ENCOUNTER — Ambulatory Visit: Admitting: Nurse Practitioner

## 2025-03-16 ENCOUNTER — Ambulatory Visit: Admitting: Nurse Practitioner
